# Patient Record
Sex: Female | Born: 1980 | Race: White | Hispanic: Yes | Marital: Married | Smoking: Never smoker
Health system: Southern US, Community
[De-identification: ages and names within clinical notes are randomized; demographics above are authoritative.]

## PROBLEM LIST (undated history)

## (undated) ENCOUNTER — Inpatient Hospital Stay (HOSPITAL_COMMUNITY): Payer: Self-pay

## (undated) HISTORY — PX: GALLBLADDER SURGERY: SHX652

---

## 2008-11-07 ENCOUNTER — Inpatient Hospital Stay (HOSPITAL_COMMUNITY): Admission: RE | Admit: 2008-11-07 | Discharge: 2008-11-09 | Payer: Self-pay | Admitting: Obstetrics

## 2009-10-01 ENCOUNTER — Emergency Department (HOSPITAL_COMMUNITY): Admission: EM | Admit: 2009-10-01 | Discharge: 2009-10-01 | Payer: Self-pay | Admitting: Emergency Medicine

## 2010-08-14 ENCOUNTER — Other Ambulatory Visit: Payer: Self-pay | Admitting: Family Medicine

## 2010-09-20 LAB — URINALYSIS, ROUTINE W REFLEX MICROSCOPIC
Bilirubin Urine: NEGATIVE
Ketones, ur: NEGATIVE mg/dL
Nitrite: NEGATIVE

## 2010-09-20 LAB — COMPREHENSIVE METABOLIC PANEL
ALT: 60 U/L — ABNORMAL HIGH (ref 0–35)
Alkaline Phosphatase: 82 U/L (ref 39–117)
Calcium: 9.3 mg/dL (ref 8.4–10.5)
Creatinine, Ser: 0.52 mg/dL (ref 0.4–1.2)
Total Bilirubin: 0.6 mg/dL (ref 0.3–1.2)
Total Protein: 7.2 g/dL (ref 6.0–8.3)

## 2010-09-20 LAB — LIPASE, BLOOD: Lipase: 21 U/L (ref 11–59)

## 2010-09-20 LAB — DIFFERENTIAL
Basophils Relative: 0 % (ref 0–1)
Eosinophils Absolute: 0.1 10*3/uL (ref 0.0–0.7)
Lymphs Abs: 1.6 10*3/uL (ref 0.7–4.0)
Monocytes Relative: 6 % (ref 3–12)
Neutro Abs: 5.9 10*3/uL (ref 1.7–7.7)

## 2010-09-20 LAB — CBC
MCHC: 33.8 g/dL (ref 30.0–36.0)
MCV: 83.2 fL (ref 78.0–100.0)
RDW: 13.7 % (ref 11.5–15.5)
WBC: 8.1 10*3/uL (ref 4.0–10.5)

## 2010-09-20 LAB — URINE MICROSCOPIC-ADD ON

## 2010-10-10 LAB — CBC
MCHC: 34 g/dL (ref 30.0–36.0)
MCHC: 34.4 g/dL (ref 30.0–36.0)
MCV: 79.4 fL (ref 78.0–100.0)
MCV: 80.3 fL (ref 78.0–100.0)
Platelets: 266 10*3/uL (ref 150–400)
RBC: 3.75 MIL/uL — ABNORMAL LOW (ref 3.87–5.11)
WBC: 10.1 10*3/uL (ref 4.0–10.5)

## 2011-09-13 ENCOUNTER — Emergency Department (INDEPENDENT_AMBULATORY_CARE_PROVIDER_SITE_OTHER): Payer: Self-pay

## 2011-09-13 ENCOUNTER — Emergency Department (INDEPENDENT_AMBULATORY_CARE_PROVIDER_SITE_OTHER)
Admission: EM | Admit: 2011-09-13 | Discharge: 2011-09-13 | Disposition: A | Discharge status: Home / Self Care | Payer: Self-pay | Source: Home / Self Care | Attending: Emergency Medicine | Admitting: Emergency Medicine

## 2011-09-13 ENCOUNTER — Encounter (HOSPITAL_COMMUNITY): Payer: Self-pay | Admitting: Emergency Medicine

## 2011-09-13 DIAGNOSIS — J40 Bronchitis, not specified as acute or chronic: Secondary | ICD-10-CM

## 2011-09-13 MED ORDER — AMOXICILLIN 500 MG PO CAPS: 1000.0000 mg | ORAL_CAPSULE | Freq: Three times a day (TID) | ORAL | Status: AC

## 2011-09-13 MED ORDER — ALBUTEROL SULFATE HFA 108 (90 BASE) MCG/ACT IN AERS: 1.0000 | INHALATION_SPRAY | Freq: Four times a day (QID) | RESPIRATORY_TRACT | Status: AC | PRN

## 2011-09-13 MED ORDER — BENZONATATE 200 MG PO CAPS: 200.0000 mg | ORAL_CAPSULE | Freq: Three times a day (TID) | ORAL | Status: AC | PRN

## 2011-09-13 NOTE — ED Notes (Signed)
Left eye pain, stuffy head, runny nose, reports cough.  Denies fever.

## 2011-09-13 NOTE — Discharge Instructions (Signed)
Bronquitis (Bronchitis) La bronquitis es [employer] (el modo que tiene el organismo de Publishing rights manager a una lesin o infeccin) de los bronquios Los bronquios son los conductos que se extienden desde la trquea [employer]. Si la inflamacin se agrava, puede causar la falta de aire. CAUSAS Las causas de la inflamacin pueden ser:  Un virus   Grmenes (bacteria).   Polvo   Alergenos   La polucin y muchos otros irritantes  Las clulas que revisten el rbol bronquial estn cubiertas con pequeos pelos (cilias). Esta constantemente producen un movimiento desde los pulmones hacia la boca. De este modo se mantienen los pulmones libres de polucin. Cuando estas clulas se irritan y no pueden cumplir su funcin, comienza a formarse la mucosidad. Esto produce la caracterstica tos de la bronquitis. La tos es el mecanismo por el cual se limpian los pulmones cuando las cilias no pueden cumplir su funcin. Sin alguno de [employer], Animator se Psychologist, clinical los pulmones Entonces se desarrollara una pulmona.  El fumar es una de las causas ms frecuentes de bronquitis y puede contribuir a la neumona. Abandonar este hbito es lo ms importante que puede hacer para beneficiarse. TRATAMIENTO  El [employer] prescribir antibiticos si la causa es una bacteria, y medicamentos para abrir las vas areas y Personnel officer. Tambin puede recomendar o prescribir un expectorante. El expectorante aflojar la mucosidad para que pueda eliminarla. Slo tome medicamentos de Sales promotion account executive o prescriptos para Primary school teacher, las [place], o bajar la fiebre segn las indicaciones de su mdico.   Pharmacologist todo lo que causa el problema (por ejemplo el hbito de Art therapist) es fundamental para evitar que empeore.   Un antitusgeno puede prescribirse para [employer] de la tos.   Podrn indicarle inhalantes para aliviar los sntomas actuales y ayudar a prevenir problemas futuros.    Aquellos que sufren bronquitis crnica (recurrente) puede ser necesaria la administracin de corticoides.  SOLICITE ATENCIN MDICA INMEDIATAMENTE SI:  Durante el tratamiento observa que elimina esputo similar a pus (purulento).   Tiene fiebre.   Su beb tiene ms de 3 meses y su temperatura rectal es de 102 F (38.9 C) o ms.   Su beb tiene 3 meses o menos y su temperatura rectal es de 100.4 F (38 C) o ms.   Se siente cada vez ms enfermo.   Tiene cada vez ms dificultad para respirar, tiene ruidos al respirar o Company secretary.  Es necesario buscar atencin mdica inmediata si es [place] persona de edad avanzada o sufre alguna otra enfermedad. ASEGURESE DE QUE:   Comprende estas instrucciones.   Controlar su enfermedad.   Solicitar ayuda inmediatamente si no mejora o si empeora.  Document Released: 06/18/2005 Document Revised: 06/07/2011 [facility] Patient Information 2012 [place], [place].   Most upper respiratory infections are caused by viruses and do not require antibiotics.  We try to save the antibiotics for when we really need them to avoid resistance.  This does not mean that there is nothing that can be done.  Here are a few hints about things that can be done at home to get over an upper respiratory infection quicker:  Get extra sleep and extra fluids.  Get 7 to 9 hours of sleep per night and 6 to 8 glasses of water a day.  Getting extra sleep keeps the immune system from getting run down.  Most people with an upper respiratory infection are a little dehydrated.  The extra fluids  also keep the secretions liquified and easier to deal with.  Also, get extra vitamin C.  4000 mg per day is the recommended dose. For the aches, headache, and fever, acetaminophen or ibuprofen are helpful.  These can be alternated every 4 hours.  People with liver disease should avoid large amounts of acetaminophen, and people with ulcer disease, gastroesophageal reflux, gastritis, congestive  heart failure, chronic kidney disease, coronary artery disease and the elderly should avoid ibuprofen. For nasal congestion try Mucinex-D, or if you're having lots of sneezing or copious clear nasal drainage Allegra-D-24 hour.  A Saline nasal spray such as Ocean Spray can also help as can decongestant sprays such as Afrin, but you should not use the decongestant sprays for more than 3 or 4 days since they can be habituating.  If nasal dryness is a problem, Ayr Nasal Gel can help moisturize your nasal passages.  Breath Rite nasal strips can also offer a non-drug alternative treatment to nasal congestion, especially at night. For people with symptoms of sinusitis, sleeping with your head elevated can be helpful.  For sinus pain, moist, hot compresses to the face may provide some relief.  Many people find that inhaling steam as in a shower or from a pot of steaming water can help. For sore throat, zinc containing lozenges such as Cold-Eze or Zicam are helpful.  Zinc helps to fight infection and has a mild astringent effect that relieves the sore, achey throat.  Hot salt water gargles (8 oz of hot water, 1/2 tsp of table salt, and a pinch of baking soda) can give relief as well as hot beverages such as hot tea. For the cough, old time remedies such as honey or honey and lemon are tried and true.  Over the counter cough syrups such as Delsym 2 tsp every 12 hours can help as well.  It's important when you have an upper respiratory infection not to pass the infection to others.  This involves being very careful about the following:  Frequent hand washing or use of hand sanitizer, especially after coughing, sneezing, blowing your nose or touching your face, nose or eyes. Do not shake hands or touch anyone and try to avoid touching surfaces that other people use such as doorknobs, shopping carts, telephones and computer keyboards. Use tissues and dispose of them properly in a garbage can or ziplock bag. Cough into  your sleeve. Do not let others eat or drink after you.  It's also important to recognize the signs of serious illness and get evaluated if they occur: Any respiratory infection that lasts more than 7 to 10 days.  Yellow nasal drainage and sputum are not reliable indicators of a bacterial infection, but if they last for more than 1 week, see your doctor. Fever and sore throat can indicate strep. Fever and cough can indicate influenza or pneumonia. Any kind of severe symptom such as difficulty breathing, intractable vomiting, or severe pain should prompt you to see a doctor as soon as possible.   Your body's immune system is really the thing that will get rid of this infection.  Your immune system is comprised of 2 types of specialized cells called T cells and B cells.  T cells coordinate the array of cells in your body that engulf invading bacteria or viruses while B cells orchestrate the production of antibodies that neutralize infection.  Anything we do or any medications we give you, will just strengthen your immune system or help it clear up the  infection quicker.  Here are a few helpful hints to improve your immune system to help overcome this illness or to prevent future infections:  A few vitamins can improve the health of your immune system.  That's why your diet should include plenty of fruits, vegetables, fish, nuts, and whole grains.  Vitamin A and bet-carotene can increase the cells that fight infections (T cells and B cells).  Vitamin A is abundant in dark greens and orange vegetables such as spinach, greens, sweet potatoes, and carrots.  Vitamin B6 contributes to the maturation of white blood cells, the cells that fight disease.  Foods with vitamin B6 include cold cereal and bananas.  Vitamin C is credited with preventing colds because it increases white blood cells and also prevents cellular damage.  Citrus fruits, peaches and green and red bell peppers are all hight in vitamin  C.  Vitamin E is an anti-oxidant that encourages the production of natural killer cells which reject foreign invaders and B cells that produce antibodies.  Foods high in vitamin E include wheat germ, nuts and seeds.  Foods high in omega-3 fatty acids found in foods like salmon, tuna and mackerel boost your immune system and help cells to engulf and absorb germs.  Probiotics are good bacteria that increase your T cells.  These can be found in yogurt and are available in supplements such as Culturelle or Align.  Moderate exercise increases the strength of your immune system and your ability to recover from illness.  I suggest 3 to 5 moderate intensity 30 minute workouts per week.    Sleep is another component of maintaining a strong immune system.  It enables your body to recuperate from the day's activities, stress and work.  My recommendation is to get between 7 and 9 hours of sleep per night.  If you smoke, try to quit completely or at least cut down.  Drink alcohol only in moderation if at all.  No more than 2 drinks daily for men or 1 for women.  Get a flu vaccine early in the fall or if you have not gotten one yet, once this illness has run its course.  If you are over 65, a smoker, or an asthmatic, get a pneumococcal vaccine.  My final recommendation is to maintain a healthy weight.  Excess weight can impair the immune system by interfering with the way the immune system deals with invading viruses or bacteria.

## 2011-09-13 NOTE — ED Provider Notes (Signed)
Chief Complaint  Patient presents with  . URI    History of Present Illness:   Tabitha Thompson is a 30 year old female who's had a one-week history of a cough productive of green sputum with occasional spots of blood, chest tightness, nasal congestion, headache, pain behind her eyes, and sore throat. She denies any fever or chills, nausea, vomiting, or diarrhea.  Review of Systems:  Other than noted above, the patient denies any of the following symptoms. Systemic:  No fever, chills, sweats, fatigue, myalgias, headache, or anorexia. Eye:  No redness, pain or drainage. ENT:  No earache, nasal congestion, rhinorrhea, sinus pressure, or sore throat. Lungs:  No cough, sputum production, wheezing, shortness of breath. Or chest pain. GI:  No nausea, vomiting, abdominal pain or diarrhea. Skin:  No rash or itching.  PMFSH:  Past medical history, family history, social history, meds, and allergies were reviewed.  Physical Exam:   Vital signs:  BP 114/80  Pulse 103  Temp(Src) 98.1 F (36.7 C) (Oral)  Resp 16  SpO2 99%  LMP 09/06/2011 General:  Alert, in no distress. Eye:  No conjunctival injection or drainage. ENT:  TMs and canals were normal, without erythema or inflammation.  Nasal mucosa was markedly congested bilaterally with a clear drainage.  Mucous membranes were moist.  Pharynx was clear, without exudate or drainage.  There were no oral ulcerations or lesions. Neck:  Supple, no adenopathy, tenderness or mass. Lungs:  No respiratory distress.  Lungs were clear to auscultation, without wheezes, rales or rhonchi.  Breath sounds were clear and equal bilaterally. Heart:  Regular rhythm, without gallops, murmers or rubs. Skin:  Clear, warm, and dry, without rash or lesions.   Radiology:  Dg Chest 2 View  09/13/2011  *RADIOLOGY REPORT*  Clinical Data: Cough, upper respiratory symptoms, sore throat, fever  CHEST - 2 VIEW  Comparison: None.  Findings: Low lung volumes accentuating the vascular markings.  Normal heart size.  Negative for pneumonia or edema.  No effusion or pneumothorax.  Trachea is midline.  Previous cholecystectomy evident.  IMPRESSION: Low volume exam.  No acute chest process  Original Report Authenticated By: Judie Petit. Ruel Favors, M.D.    Assessment:   Diagnoses that have been ruled out:  None  Diagnoses that are still under consideration:  None  Final diagnoses:  Bronchitis      Plan:   1.  The following meds were prescribed:   New Prescriptions   ALBUTEROL (PROVENTIL HFA;VENTOLIN HFA) 108 (90 BASE) MCG/ACT INHALER    Inhale 1-2 puffs into the lungs every 6 (six) hours as needed for wheezing.   AMOXICILLIN (AMOXIL) 500 MG CAPSULE    Take 2 capsules (1,000 mg total) by mouth 3 (three) times daily.   BENZONATATE (TESSALON) 200 MG CAPSULE    Take 1 capsule (200 mg total) by mouth 3 (three) times daily as needed for cough.   2.  The patient was instructed in symptomatic care and handouts were given. 3.  The patient was told to return if becoming worse in any way, if no better in 3 or 4 days, and given some red flag symptoms that would indicate earlier return.'  Reuben Likes, MD 09/13/11 1635

## 2013-05-19 ENCOUNTER — Inpatient Hospital Stay (HOSPITAL_COMMUNITY): Payer: Medicaid Other

## 2013-05-19 ENCOUNTER — Inpatient Hospital Stay (HOSPITAL_COMMUNITY)
Admission: AD | Admit: 2013-05-19 | Discharge: 2013-05-19 | Disposition: A | Discharge status: Home / Self Care | Payer: Self-pay | Source: Ambulatory Visit | Attending: Obstetrics & Gynecology | Admitting: Obstetrics & Gynecology

## 2013-05-19 ENCOUNTER — Encounter (HOSPITAL_COMMUNITY): Payer: Self-pay | Admitting: *Deleted

## 2013-05-19 DIAGNOSIS — N390 Urinary tract infection, site not specified: Secondary | ICD-10-CM | POA: Insufficient documentation

## 2013-05-19 DIAGNOSIS — O2341 Unspecified infection of urinary tract in pregnancy, first trimester: Secondary | ICD-10-CM

## 2013-05-19 DIAGNOSIS — Z3490 Encounter for supervision of normal pregnancy, unspecified, unspecified trimester: Secondary | ICD-10-CM

## 2013-05-19 DIAGNOSIS — B3731 Acute candidiasis of vulva and vagina: Secondary | ICD-10-CM | POA: Insufficient documentation

## 2013-05-19 DIAGNOSIS — O2390 Unspecified genitourinary tract infection in pregnancy, unspecified trimester: Secondary | ICD-10-CM | POA: Insufficient documentation

## 2013-05-19 DIAGNOSIS — B373 Candidiasis of vulva and vagina: Secondary | ICD-10-CM

## 2013-05-19 DIAGNOSIS — O209 Hemorrhage in early pregnancy, unspecified: Secondary | ICD-10-CM | POA: Insufficient documentation

## 2013-05-19 LAB — URINE MICROSCOPIC-ADD ON

## 2013-05-19 LAB — URINALYSIS, ROUTINE W REFLEX MICROSCOPIC
Bilirubin Urine: NEGATIVE
Glucose, UA: NEGATIVE mg/dL
Ketones, ur: NEGATIVE mg/dL
Protein, ur: NEGATIVE mg/dL
Specific Gravity, Urine: >1.03 — ABNORMAL HIGH (ref 1.005–1.030)
pH: 6 (ref 5.0–8.0)

## 2013-05-19 LAB — WET PREP, GENITAL: Clue Cells Wet Prep HPF POC: NONE SEEN

## 2013-05-19 LAB — CBC
HCT: 38.8 % (ref 36.0–46.0)
MCH: 26.4 pg (ref 26.0–34.0)
MCV: 77 fL — ABNORMAL LOW (ref 78.0–100.0)

## 2013-05-19 MED ORDER — FLUCONAZOLE 150 MG PO TABS
150.0000 mg | ORAL_TABLET | Freq: Once | ORAL | Status: AC
  Administered 2013-05-19: 150 mg via ORAL
  Filled 2013-05-19: qty 1

## 2013-05-19 MED ORDER — FLUCONAZOLE 150 MG PO TABS: 150.0000 mg | ORAL_TABLET | Freq: Every day | ORAL | Status: DC

## 2013-05-19 NOTE — MAU Note (Signed)
Was dx with a UTI at clinic and given rx.

## 2013-05-19 NOTE — MAU Provider Note (Signed)
History     CSN: [id number]  Arrival date and time: 05/19/13 1550   First Provider Initiated Contact with Patient 05/19/13 1652      Chief Complaint  Patient presents with  . Threatened Miscarriage   HPI  [name] is a 32 y.o. female who presents to MAU with complaints of vaginal bleeding that started this morning. [facility]; she was seen there last Monday and Wednesday. At that time they did not do an [place]. The bleeding is very mild, and only noticed it when she wiped. She denies pain at this time. Pt is currently taking Macrobid for a UTI.   OB History   Grav Para Term Preterm Abortions TAB SAB Ect Mult Living                  No past medical history on file.  No past surgical history on file.  No family history on file.  History  Substance Use Topics  . Smoking status: Never Smoker   . Smokeless tobacco: Not on file  . Alcohol Use: No    Allergies: No Known Allergies  Prescriptions prior to admission  Medication Sig Dispense Refill  . ibuprofen (ADVIL,MOTRIN) 200 MG tablet Take 200 mg by mouth every 6 (six) hours as needed.       Results for orders placed during the hospital encounter of 05/19/13 (from the past 24 hour(s))  CBC     Status: Abnormal   Collection Time    05/19/13  4:33 PM      Result Value Range   WBC 9.1  4.0 - 10.5 K/uL   RBC 5.04  3.87 - 5.11 MIL/uL   Hemoglobin 13.3  12.0 - 15.0 g/dL   HCT 40.9  81.1 - 91.4 %   MCV 77.0 (*) 78.0 - 100.0 fL   MCH 26.4  26.0 - 34.0 pg   MCHC 34.3  30.0 - 36.0 g/dL   RDW 78.2  95.6 - 21.3 %   Platelets 246  150 - 400 K/uL  ABO/RH     Status: None   Collection Time    05/19/13  4:33 PM      Result Value Range   ABO/RH(D) A POS    HCG, QUANTITATIVE, PREGNANCY     Status: Abnormal   Collection Time    05/19/13  4:33 PM      Result Value Range   hCG, Beta Chain, Quant, S 31551 (*) <5 mIU/mL  URINALYSIS, ROUTINE W REFLEX  MICROSCOPIC     Status: Abnormal   Collection Time    05/19/13  4:42 PM      Result Value Range   Color, Urine YELLOW  YELLOW   APPearance CLEAR  CLEAR   Specific Gravity, Urine >1.030 (*) 1.005 - 1.030   pH 6.0  5.0 - 8.0   Glucose, UA NEGATIVE  NEGATIVE mg/dL   Hgb urine dipstick TRACE (*) NEGATIVE   Bilirubin Urine NEGATIVE  NEGATIVE   Ketones, ur NEGATIVE  NEGATIVE mg/dL   Protein, ur NEGATIVE  NEGATIVE mg/dL   Urobilinogen, UA 0.2  0.0 - 1.0 mg/dL   Nitrite NEGATIVE  NEGATIVE   Leukocytes, UA MODERATE (*) NEGATIVE  URINE MICROSCOPIC-ADD ON     Status: Abnormal   Collection Time    05/19/13  4:42 PM      Result Value Range   Squamous Epithelial / LPF FEW (*) RARE  WBC, UA 0-2  <3 WBC/hpf   RBC / HPF 0-2  <3 RBC/hpf   Bacteria, UA RARE  RARE   Urine-Other MUCOUS PRESENT    POCT PREGNANCY, URINE     Status: Abnormal   Collection Time    05/19/13  4:56 PM      Result Value Range   Preg Test, Ur POSITIVE (*) NEGATIVE  WET PREP, GENITAL     Status: Abnormal   Collection Time    05/19/13  5:57 PM      Result Value Range   Yeast Wet Prep HPF POC MODERATE (*) NONE SEEN   Trich, Wet Prep NONE SEEN  NONE SEEN   Clue Cells Wet Prep HPF POC NONE SEEN  NONE SEEN   WBC, Wet Prep HPF POC FEW (*) NONE SEEN    US Ob Comp Less 14 Wks  05/19/2013   CLINICAL DATA:  Early pregnancy.  Vaginal bleeding.  EXAM: TRANSVAGINAL OB ULTRASOUND; OBSTETRIC <14 WK ULTRASOUND  TECHNIQUE: Transvaginal ultrasound was performed for complete evaluation of the gestation as well as the maternal uterus, adnexal regions, and pelvic cul-de-sac.  COMPARISON:  No priors.  FINDINGS: Intrauterine gestational sac: Single ovoid shaped gestational sac in the fundal portion of the endometrial canal.  Yolk sac:  Present.  Embryo:  Present.  Cardiac Activity: Present.  Heart Rate: 160 bpm  CRL:   15  mm   7 w 6 d                  [place] EDC: 12/30/2013  Maternal uterus/adnexae: No evidence of subchorionic hemorrhage at this  time. Right ovary is poorly visualized, however, there appears to be a dominant follicle or degenerating corpus luteum cyst in the right ovary. Left ovary is unremarkable in appearance. No significant free fluid in the cul-de-sac.  IMPRESSION: 1. Single viable IUP with estimated gestational age of [redacted] weeks and 6 days and normal fetal heart rate of 160 beats per min. No acute findings.   Electronically Signed   By: Trudie Reed M.D.   On: 05/19/2013 17:50   US Ob Transvaginal  05/19/2013   CLINICAL DATA:  Early pregnancy.  Vaginal bleeding.  EXAM: TRANSVAGINAL OB ULTRASOUND; OBSTETRIC <14 WK ULTRASOUND  TECHNIQUE: Transvaginal ultrasound was performed for complete evaluation of the gestation as well as the maternal uterus, adnexal regions, and pelvic cul-de-sac.  COMPARISON:  No priors.  FINDINGS: Intrauterine gestational sac: Single ovoid shaped gestational sac in the fundal portion of the endometrial canal.  Yolk sac:  Present.  Embryo:  Present.  Cardiac Activity: Present.  Heart Rate: 160 bpm  CRL:   15  mm   7 w 6 d                  [place] EDC: 12/30/2013  Maternal uterus/adnexae: No evidence of subchorionic hemorrhage at this time. Right ovary is poorly visualized, however, there appears to be a dominant follicle or degenerating corpus luteum cyst in the right ovary. Left ovary is unremarkable in appearance. No significant free fluid in the cul-de-sac.  IMPRESSION: 1. Single viable IUP with estimated gestational age of [redacted] weeks and 6 days and normal fetal heart rate of 160 beats per min. No acute findings.   Electronically Signed   By: Trudie Reed M.D.   On: 05/19/2013 17:50    Review of Systems  Constitutional: Positive for chills. Negative for fever.  Gastrointestinal: Positive for nausea and vomiting. Negative for abdominal pain, diarrhea and  constipation.  Genitourinary: Positive for dysuria, urgency and frequency. Negative for hematuria.       + vaginal discharge; noticed yellow discharge  this past Sunday.  + vaginal bleeding. No dysuria.    Physical Exam   Blood pressure 147/84, pulse 94, temperature 98.9 F (37.2 C), temperature source Oral, resp. rate 20, height 5' 2.5" (1.588 m), weight 85.276 kg (188 lb).  Physical Exam  Constitutional: She is oriented to person, place, and time. She appears well-developed and well-nourished. No distress.  HENT:  Head: Normocephalic.  Eyes: Pupils are equal, round, and reactive to light.  Neck: Neck supple.  Respiratory: Effort normal.  GI: Soft. She exhibits no distension. There is no tenderness. There is no rebound and no guarding.  Genitourinary: Vaginal discharge found.  Speculum exam: Vagina - Moderate amount of creamy, yellow discharge, no odor, mild erythema  Cervix - Thick, mucus like discharge at cervical os. Bimanual exam: Cervix closed Uterus non tender, normal size; gravid  Adnexa non tender, no masses bilaterally GC/Chlam, wet prep done Chaperone present for exam.   Musculoskeletal: Normal range of motion.  Neurological: She is alert and oriented to person, place, and time.  Skin: Skin is warm. She is not diaphoretic.    MAU Course  Procedures None  MDM UA CBC CMET ABO/RH UA  A positive blood type  Assessment and Plan   A:  1. Urinary tract infection during pregnancy in first trimester   2. Normal IUP (intrauterine pregnancy) on prenatal ultrasound   3. Yeast vaginitis     P: Discharge home RX: Diflucan  Continue Macrobid   Return to MAU as needed, if symptoms worsen.  Start prenatal care as soon as possible; pregnancy verification letter provided.   RASCH, JENNIFER IRENE NP 05/19/2013, 6:43 PM

## 2013-05-19 NOTE — MAU Note (Addendum)
[facility], [facility] and [facility].  Today she did not feel well and started bleeding this morning.  No bleeding when went to bathroom now.

## 2013-05-19 NOTE — MAU Note (Signed)
Pt states she vaginal bleeding this morning but bleeding has stopped

## 2013-05-19 NOTE — MAU Note (Addendum)
Not in lobby, in restroom 

## 2013-05-19 NOTE — MAU Note (Signed)
Sat, she started have UTI symptoms; freq and some burning.

## 2013-05-20 LAB — GC/CHLAMYDIA PROBE AMP: GC Probe RNA: NEGATIVE

## 2013-06-18 LAB — OB RESULTS CONSOLE RUBELLA ANTIBODY, IGM: RUBELLA: IMMUNE

## 2013-06-18 LAB — OB RESULTS CONSOLE ANTIBODY SCREEN: Antibody Screen: NEGATIVE

## 2013-06-18 LAB — OB RESULTS CONSOLE GC/CHLAMYDIA
Chlamydia: NEGATIVE
Gonorrhea: NEGATIVE

## 2013-06-18 LAB — OB RESULTS CONSOLE ABO/RH: RH TYPE: POSITIVE

## 2013-06-18 LAB — OB RESULTS CONSOLE RPR: RPR: NONREACTIVE

## 2013-06-18 LAB — CYTOLOGY - PAP: Pap: NEGATIVE

## 2013-06-18 LAB — OB RESULTS CONSOLE HIV ANTIBODY (ROUTINE TESTING): HIV: NONREACTIVE

## 2013-06-18 LAB — OB RESULTS CONSOLE HEPATITIS B SURFACE ANTIGEN: Hepatitis B Surface Ag: NEGATIVE

## 2013-07-02 NOTE — L&D Delivery Note (Signed)
Delivery Note At 1:28 PM a viable female was delivered via Vaginal, Vacuum (Extractor) (Presentation: ; Occiput Anterior).  APGAR: 7, 9; weight .   Placenta status: Intact, Spontaneous.  Cord: 3 vessels with the following complications: None.  Cord pH: not done  Anesthesia: None  Episiotomy: Median Lacerations: None Suture Repair: 2.0 vicryl Est. Blood Loss (mL):   Mom to postpartum.  Baby to Couplet care / Skin to Skin.  MARSHALL,BERNARD A 01/02/2014, 1:53 PM

## 2013-07-28 ENCOUNTER — Other Ambulatory Visit (HOSPITAL_COMMUNITY): Payer: Self-pay | Admitting: Obstetrics

## 2013-07-28 DIAGNOSIS — Z3689 Encounter for other specified antenatal screening: Secondary | ICD-10-CM

## 2013-08-03 ENCOUNTER — Ambulatory Visit (HOSPITAL_COMMUNITY)
Admission: RE | Admit: 2013-08-03 | Discharge: 2013-08-03 | Disposition: A | Discharge status: Home / Self Care | Payer: No Typology Code available for payment source | Source: Ambulatory Visit | Attending: Obstetrics | Admitting: Obstetrics

## 2013-08-03 DIAGNOSIS — Z3689 Encounter for other specified antenatal screening: Secondary | ICD-10-CM

## 2013-08-07 ENCOUNTER — Other Ambulatory Visit (HOSPITAL_COMMUNITY): Payer: Self-pay | Admitting: Obstetrics

## 2013-08-07 DIAGNOSIS — Z3689 Encounter for other specified antenatal screening: Secondary | ICD-10-CM

## 2013-10-01 ENCOUNTER — Ambulatory Visit (HOSPITAL_COMMUNITY)
Admission: RE | Admit: 2013-10-01 | Discharge: 2013-10-01 | Disposition: A | Discharge status: Home / Self Care | Payer: No Typology Code available for payment source | Source: Ambulatory Visit | Attending: Obstetrics | Admitting: Obstetrics

## 2013-10-01 DIAGNOSIS — Z3689 Encounter for other specified antenatal screening: Secondary | ICD-10-CM | POA: Insufficient documentation

## 2013-12-01 LAB — OB RESULTS CONSOLE GC/CHLAMYDIA
CHLAMYDIA, DNA PROBE: NEGATIVE
Gonorrhea: NEGATIVE

## 2013-12-01 LAB — OB RESULTS CONSOLE GBS: GBS: NEGATIVE

## 2014-01-02 ENCOUNTER — Inpatient Hospital Stay (HOSPITAL_COMMUNITY)
Admission: AD | Admit: 2014-01-02 | Discharge: 2014-01-04 | DRG: 775 | Disposition: A | Discharge status: Home / Self Care | Payer: Medicaid Other | Source: Ambulatory Visit | Attending: Obstetrics | Admitting: Obstetrics

## 2014-01-02 ENCOUNTER — Encounter (HOSPITAL_COMMUNITY): Payer: Self-pay | Admitting: *Deleted

## 2014-01-02 DIAGNOSIS — O9279 Other disorders of lactation: Secondary | ICD-10-CM | POA: Diagnosis not present

## 2014-01-02 DIAGNOSIS — O479 False labor, unspecified: Secondary | ICD-10-CM | POA: Diagnosis present

## 2014-01-02 LAB — CBC
HEMATOCRIT: 39 % (ref 36.0–46.0)
Hemoglobin: 13.1 g/dL (ref 12.0–15.0)
MCH: 27.1 pg (ref 26.0–34.0)
MCHC: 33.6 g/dL (ref 30.0–36.0)
MCV: 80.6 fL (ref 78.0–100.0)
Platelets: 231 10*3/uL (ref 150–400)
RBC: 4.84 MIL/uL (ref 3.87–5.11)
RDW: 16.2 % — ABNORMAL HIGH (ref 11.5–15.5)
WBC: 12.9 10*3/uL — ABNORMAL HIGH (ref 4.0–10.5)

## 2014-01-02 LAB — RPR

## 2014-01-02 LAB — TYPE AND SCREEN
ABO/RH(D): A POS
Antibody Screen: NEGATIVE

## 2014-01-02 MED ORDER — DIPHENHYDRAMINE HCL 50 MG/ML IJ SOLN: 12.5000 mg | INTRAMUSCULAR | Status: DC | PRN

## 2014-01-02 MED ORDER — SIMETHICONE 80 MG PO CHEW: 80.0000 mg | CHEWABLE_TABLET | ORAL | Status: DC | PRN

## 2014-01-02 MED ORDER — ONDANSETRON HCL 4 MG PO TABS: 4.0000 mg | ORAL_TABLET | ORAL | Status: DC | PRN

## 2014-01-02 MED ORDER — IBUPROFEN 600 MG PO TABS
600.0000 mg | ORAL_TABLET | Freq: Four times a day (QID) | ORAL | Status: DC | PRN
  Administered 2014-01-02: 600 mg via ORAL
  Filled 2014-01-02: qty 1

## 2014-01-02 MED ORDER — ONDANSETRON HCL 4 MG/2ML IJ SOLN: 4.0000 mg | INTRAMUSCULAR | Status: DC | PRN

## 2014-01-02 MED ORDER — ACETAMINOPHEN 325 MG PO TABS: 650.0000 mg | ORAL_TABLET | ORAL | Status: DC | PRN

## 2014-01-02 MED ORDER — OXYTOCIN BOLUS FROM INFUSION
500.0000 mL | INTRAVENOUS | Status: DC
  Administered 2014-01-02: 500 mL via INTRAVENOUS

## 2014-01-02 MED ORDER — OXYCODONE-ACETAMINOPHEN 5-325 MG PO TABS
1.0000 | ORAL_TABLET | ORAL | Status: DC | PRN
  Administered 2014-01-02: 1 via ORAL
  Filled 2014-01-02: qty 1

## 2014-01-02 MED ORDER — PRENATAL MULTIVITAMIN CH
1.0000 | ORAL_TABLET | Freq: Every day | ORAL | Status: DC
  Administered 2014-01-03 – 2014-01-04 (×2): 1 via ORAL
  Filled 2014-01-02 (×2): qty 1

## 2014-01-02 MED ORDER — TETANUS-DIPHTH-ACELL PERTUSSIS 5-2.5-18.5 LF-MCG/0.5 IM SUSP
0.5000 mL | Freq: Once | INTRAMUSCULAR | Status: AC
  Administered 2014-01-04: 0.5 mL via INTRAMUSCULAR

## 2014-01-02 MED ORDER — EPHEDRINE 5 MG/ML INJ
10.0000 mg | INTRAVENOUS | Status: DC | PRN
  Filled 2014-01-02: qty 2

## 2014-01-02 MED ORDER — OXYTOCIN 40 UNITS IN LACTATED RINGERS INFUSION - SIMPLE MED
62.5000 mL/h | INTRAVENOUS | Status: DC
  Filled 2014-01-02: qty 1000

## 2014-01-02 MED ORDER — PHENYLEPHRINE 40 MCG/ML (10ML) SYRINGE FOR IV PUSH (FOR BLOOD PRESSURE SUPPORT)
80.0000 ug | PREFILLED_SYRINGE | INTRAVENOUS | Status: DC | PRN
  Filled 2014-01-02: qty 2

## 2014-01-02 MED ORDER — DIPHENHYDRAMINE HCL 25 MG PO CAPS: 25.0000 mg | ORAL_CAPSULE | Freq: Four times a day (QID) | ORAL | Status: DC | PRN

## 2014-01-02 MED ORDER — FERROUS SULFATE 325 (65 FE) MG PO TABS
325.0000 mg | ORAL_TABLET | Freq: Two times a day (BID) | ORAL | Status: DC
  Administered 2014-01-02 – 2014-01-04 (×4): 325 mg via ORAL
  Filled 2014-01-02 (×5): qty 1

## 2014-01-02 MED ORDER — OXYCODONE-ACETAMINOPHEN 5-325 MG PO TABS: 1.0000 | ORAL_TABLET | ORAL | Status: DC | PRN

## 2014-01-02 MED ORDER — IBUPROFEN 600 MG PO TABS
600.0000 mg | ORAL_TABLET | Freq: Four times a day (QID) | ORAL | Status: DC
  Administered 2014-01-02 – 2014-01-04 (×8): 600 mg via ORAL
  Filled 2014-01-02 (×9): qty 1

## 2014-01-02 MED ORDER — LIDOCAINE HCL (PF) 1 % IJ SOLN
30.0000 mL | INTRAMUSCULAR | Status: AC | PRN
  Administered 2014-01-02: 30 mL via SUBCUTANEOUS
  Filled 2014-01-02: qty 30

## 2014-01-02 MED ORDER — ONDANSETRON HCL 4 MG/2ML IJ SOLN
4.0000 mg | Freq: Four times a day (QID) | INTRAMUSCULAR | Status: DC | PRN
Start: 2014-01-02 — End: 2014-01-02

## 2014-01-02 MED ORDER — ZOLPIDEM TARTRATE 5 MG PO TABS: 5.0000 mg | ORAL_TABLET | Freq: Every evening | ORAL | Status: DC | PRN

## 2014-01-02 MED ORDER — WITCH HAZEL-GLYCERIN EX PADS: 1.0000 "application " | MEDICATED_PAD | CUTANEOUS | Status: DC | PRN

## 2014-01-02 MED ORDER — BENZOCAINE-MENTHOL 20-0.5 % EX AERO: 1.0000 "application " | INHALATION_SPRAY | CUTANEOUS | Status: DC | PRN

## 2014-01-02 MED ORDER — SENNOSIDES-DOCUSATE SODIUM 8.6-50 MG PO TABS
2.0000 | ORAL_TABLET | ORAL | Status: DC
  Administered 2014-01-03 (×2): 2 via ORAL
  Filled 2014-01-02 (×3): qty 2

## 2014-01-02 MED ORDER — BUTORPHANOL TARTRATE 1 MG/ML IJ SOLN: 1.0000 mg | INTRAMUSCULAR | Status: DC | PRN

## 2014-01-02 MED ORDER — LACTATED RINGERS IV SOLN: 500.0000 mL | Freq: Once | INTRAVENOUS | Status: DC

## 2014-01-02 MED ORDER — FENTANYL 2.5 MCG/ML BUPIVACAINE 1/10 % EPIDURAL INFUSION (WH - ANES): 14.0000 mL/h | INTRAMUSCULAR | Status: DC | PRN

## 2014-01-02 MED ORDER — LACTATED RINGERS IV SOLN
INTRAVENOUS | Status: DC
  Administered 2014-01-02: 12:00:00 via INTRAVENOUS

## 2014-01-02 MED ORDER — LACTATED RINGERS IV SOLN: 500.0000 mL | INTRAVENOUS | Status: DC | PRN

## 2014-01-02 MED ORDER — DIBUCAINE 1 % RE OINT: 1.0000 "application " | TOPICAL_OINTMENT | RECTAL | Status: DC | PRN

## 2014-01-02 MED ORDER — LANOLIN HYDROUS EX OINT: TOPICAL_OINTMENT | CUTANEOUS | Status: DC | PRN

## 2014-01-02 MED ORDER — CITRIC ACID-SODIUM CITRATE 334-500 MG/5ML PO SOLN: 30.0000 mL | ORAL | Status: DC | PRN

## 2014-01-02 NOTE — H&P (Signed)
This is Dr. Francoise CeoBernard Marshall dictating the history and physical on  Tabitha Thompson she's a 33 year old gravida 2 para 1001 at 40 weeks and 3 days [facility] GBS came to labor and delivery fully dilated normal vaginal delivery of a female Apgar 7 and 9 endosonographic meconium to the midline episiotomy and repair with 2-0 Vicryl and the placenta was spontaneous intact Past medical history negative Past surgical history negative Social history negative System review negative Physical exam well-developed female in labor HEENT negative Lungs clear to P&A Breasts engorged Abdomen uterus 20 week postpartum size Pelvic as described above Extremities negative

## 2014-01-02 NOTE — Progress Notes (Signed)
Dr Gaynell FaceMarshall notified of pt's VE, contraction pattern, FHR pattern, orders received to recheck cervix in an hour

## 2014-01-02 NOTE — MAU Note (Signed)
Per Wynona Caneshristine, RN charge, pt to go to room 174

## 2014-01-02 NOTE — MAU Note (Signed)
Pt presents to MAU with complaints of contractions since yesterday with some bloody show noted.

## 2014-01-03 LAB — CBC
HEMATOCRIT: 31.4 % — AB (ref 36.0–46.0)
Hemoglobin: 10.2 g/dL — ABNORMAL LOW (ref 12.0–15.0)
MCH: 26.3 pg (ref 26.0–34.0)
MCHC: 32.5 g/dL (ref 30.0–36.0)
MCV: 80.9 fL (ref 78.0–100.0)
PLATELETS: 218 10*3/uL (ref 150–400)
RBC: 3.88 MIL/uL (ref 3.87–5.11)
RDW: 16.6 % — ABNORMAL HIGH (ref 11.5–15.5)
WBC: 13.3 10*3/uL — AB (ref 4.0–10.5)

## 2014-01-03 NOTE — Lactation Note (Signed)
This note was copied from the chart of Tabitha Thompson. Lactation Consultation Note  Patient Name: Tabitha Thompson [id number]'s Date: 01/03/2014 Reason for consult: Initial assessment;Hyperbilirubinemia.  This baby is now 5432 hours of age and after 2 unsuccessful latch attempts after delivery with a sleepy baby, mom began offering formula and now baby is exclusively bottle-feeding, with formula up to 30 ml's.  Mom had intended upon admission to both breast and bottle/formula feed but informs LC at time of this visit, that she would still like to try breastfeeding and she is willing to pump and feed ebm by bottle if baby not latching.  Baby under phototherapy light and asleep at this time. LC discussed plan with RN, Sheralyn Boatmanoni and will return to assist mom to initiate DEBP.   Maternal Data Formula Feeding for Exclusion: Yes Reason for exclusion: Mother's choice to formula and breast feed on admission  Feeding Feeding Type: Bottle Fed - Formula Nipple Type: Regular  LATCH Score/Interventions         Two unsuccessful latch attempts after delivery, then only bottle-feeding             Lactation Tools Discussed/Used Tools: Pump Breast pump type: Double-Electric Breast Pump Pump Review: Setup, frequency, and cleaning;Milk Storage Initiated by:: J.Dejae Bernet, IBCLC Date initiated:: 01/03/14  STS, cue feedings, hand expression  Consult Status Consult Status: Follow-up Date: 01/04/14 Follow-up type: In-patient    Warrick ParisianBryant, Tillmon Kisling [facility] 01/03/2014, 10:19 PM

## 2014-01-03 NOTE — Progress Notes (Signed)
Patient ID: Tabitha SinghDiana Thompson, female   DOB: January 26, 1981, 33 y.o.   MRN: [id number] Postpartum day one Vital signs normal Fundus firm Lochia moderate Doing well

## 2014-01-03 NOTE — Lactation Note (Signed)
This note was copied from the chart of Boy Alvera SinghDiana Garcia-Lopez. Lactation Consultation Note  Patient Name: Boy Alvera SinghDiana Garcia-Lopez ZOXWR'UToday's Date: 01/03/2014 Reason for consult: Follow-up assessment;Other (Comment) (will need WIC pump (loaner) at discharge) LC assisted mom with FOB present and reviewed use, cleaning and recommendations to pump q3h for 15 minutes (written in Spanish on board) and LC able to speak Spanish and explain plan.  Parents verbalize understanding and decline need for interpreter at this time.  Plan also reported to RN, Sheralyn Boatmanoni.  Hand expression demonstrated and LC encouraged parents to watch newborn channel breastfeeding videos. Mom encouraged to feed baby 8-12 times/24 hours and with feeding cues. LC encouraged review of Baby and Me pp 13-16 for review of BF information in [place].LC provided [employer] brochure in Spanish, and reviewed WH services and list of community and web site resources, especially [employer] website which has information available in [place]..    Maternal Data Formula Feeding for Exclusion: Yes Reason for exclusion: Mother's choice to formula and breast feed on admission  Feeding Feeding Type: Bottle Fed - Formula Nipple Type: Regular  LATCH Score/Interventions                      Lactation Tools Discussed/Used Tools: Pump Breast pump type: Double-Electric Breast Pump WIC Program: Yes (mom receiving WIC while pregnant) Pump Review: Setup, frequency, and cleaning;Milk Storage Initiated by:: J.Kirah Stice, IBCLC Date initiated:: 01/03/14   Consult Status Consult Status: Follow-up Date: 01/04/14 Follow-up type: In-patient    Warrick ParisianBryant, Roc Streett [facility] 01/03/2014, 10:58 PM

## 2014-01-04 NOTE — Progress Notes (Signed)
Ur chart review completed.  

## 2014-01-04 NOTE — Lactation Note (Signed)
This note was copied from the chart of Boy Alvera SinghDiana Garcia-Lopez. Lactation Consultation Note  Patient Name: Boy Alvera SinghDiana Garcia-Lopez QMVHQ'IToday's Date: 01/04/2014 Reason for consult: Follow-up assessment;Difficult latch Shanda BumpsJessica, Spanish interpreter present for visit. Mom recently fed baby but wants to try nipple shield before d/c. Ready to go home. LC attempted to use #24 nipple shield to help with latch, however baby would not latch as baby has had 20 ml of EBM and 27 ml of formula in the past hour.  Mom has been pump and bottle feeding along with supplementing with formula. She has hand pump for home use [facility] Mom reports she did call WIC but did not speak to a counselor and did not leave phone number. Advised Mom to continue pump and bottle for now since baby is too sleepy to latch and LC cannot assess if baby can latch or will latch to nipple shield. Advised Mom to pump every 3 hours for 15-20 minutes to encourage milk production and protect milk supply. Advised to call WIC in am and speak with someone or leave phone number so she can get DEBP. Offered to schedule OP f/u with lactation to work baby to breast, Mom declined. She will advise if she decides on follow up. Engorgement care reviewed if needed.   Maternal Data    Feeding Feeding Type: Breast Fed Nipple Type: Slow - flow Length of feed: 0 min  LATCH Score/Interventions Latch: Too sleepy or reluctant, no latch achieved, no sucking elicited.     Type of Nipple: Everted at rest and after stimulation  Comfort (Breast/Nipple): Filling, red/small blisters or bruises, mild/mod discomfort  Problem noted: Filling  Hold (Positioning): Assistance needed to correctly position infant at breast and maintain latch.     Lactation Tools Discussed/Used Tools: Pump Breast pump type: Double-Electric Breast Pump   Consult Status Consult Status: Complete Date: 01/04/14 Follow-up type: In-patient    Alfred LevinsGranger, Ashonte Angelucci  Ann 01/04/2014, 4:13 PM

## 2014-01-04 NOTE — Discharge Instructions (Signed)
Discharge instructions ° °· You can wash your hair °· Shower °· Eat what you want °· Drink what you want °· See me in 6 weeks °· Your ankles are going to swell more in the next 2 weeks than when pregnant °· No sex for 6 weeks ° ° °Kaydance Bowie A, MD 01/04/2014 ° ° °

## 2014-01-04 NOTE — Progress Notes (Signed)
Patient ID: Alvera SinghDiana Thompson, female   DOB: 10/18/1980, 33 y.o.   MRN: [id number] Postpartum day 2 Vital signs normal Fundus firm Doing well Home today

## 2014-01-04 NOTE — Discharge Summary (Signed)
Obstetric Discharge Summary Reason for Admission: induction of labor Prenatal Procedures: none Intrapartum Procedures: spontaneous vaginal delivery Postpartum Procedures: none Complications-Operative and Postpartum: none Hemoglobin  Date Value Ref Range Status  01/03/2014 10.2* 12.0 - 15.0 g/dL Final     DELTA CHECK NOTED     REPEATED TO VERIFY     HCT  Date Value Ref Range Status  01/03/2014 31.4* 36.0 - 46.0 % Final    Physical Exam:  General: alert Lochia: appropriate Uterine Fundus: firm Incision: healing well DVT Evaluation: No evidence of DVT seen on physical exam.  Discharge Diagnoses: Term Pregnancy-delivered  Discharge Information: Date: 01/04/2014 Activity: pelvic rest Diet: routine Medications: Percocet Condition: stable Instructions: refer to practice specific booklet Discharge to: home Follow-up Information   Follow up with MARSHALL,BERNARD A, MD. Schedule an appointment as soon as possible for a visit in 6 weeks.   Specialty:  Obstetrics and Gynecology   Contact information:   [address] [place] [id number] [phone]       Newborn Data: Live born female  Birth Weight: 6 lb 13.2 oz (3095 g) APGAR: 7, 9  Home with mother.  MARSHALL,BERNARD A 01/04/2014, 6:22 AM

## 2014-01-04 NOTE — Lactation Note (Signed)
This note was copied from the chart of Boy Alvera SinghDiana Garcia-Lopez. Lactation Consultation Note  Patient Name: Boy Alvera SinghDiana Garcia-Lopez ZOXWR'UToday's Date: 01/04/2014  Arlyn DunningEda, Spanish interpreter present for visit. Mom is not able to get baby to latch consistently. She just gave baby 30 ml of Gerber formula so baby not hungry at this visit. Mom reports her plan is to keep offering the breast, pump to bring in milk and supplement with formula as needed. [facility] [place] Not sure of d/c today, awaiting bili levels this afternoon. Discussed WIC loaner program if Mom will need this. Stressed importance of consistent pumping to bring in milk supply till baby can latch. Encouraged [facility] for assist with baby at the breast.    Maternal Data    Feeding Feeding Type: Formula Nipple Type: Slow - flow  LATCH Score/Interventions                      Lactation Tools Discussed/Used     Consult Status      Alfred LevinsGranger, Inigo Lantigua Ann 01/04/2014, 1:58 PM

## 2014-03-13 IMAGING — CR DG CHEST 2V
2 series · 2 of 2 positions shown · non-contrast
Comparison: None.

CLINICAL DATA: Cough, upper respiratory symptoms, sore throat,
fever

CHEST - 2 VIEW

[view not recorded (1 of 2)]
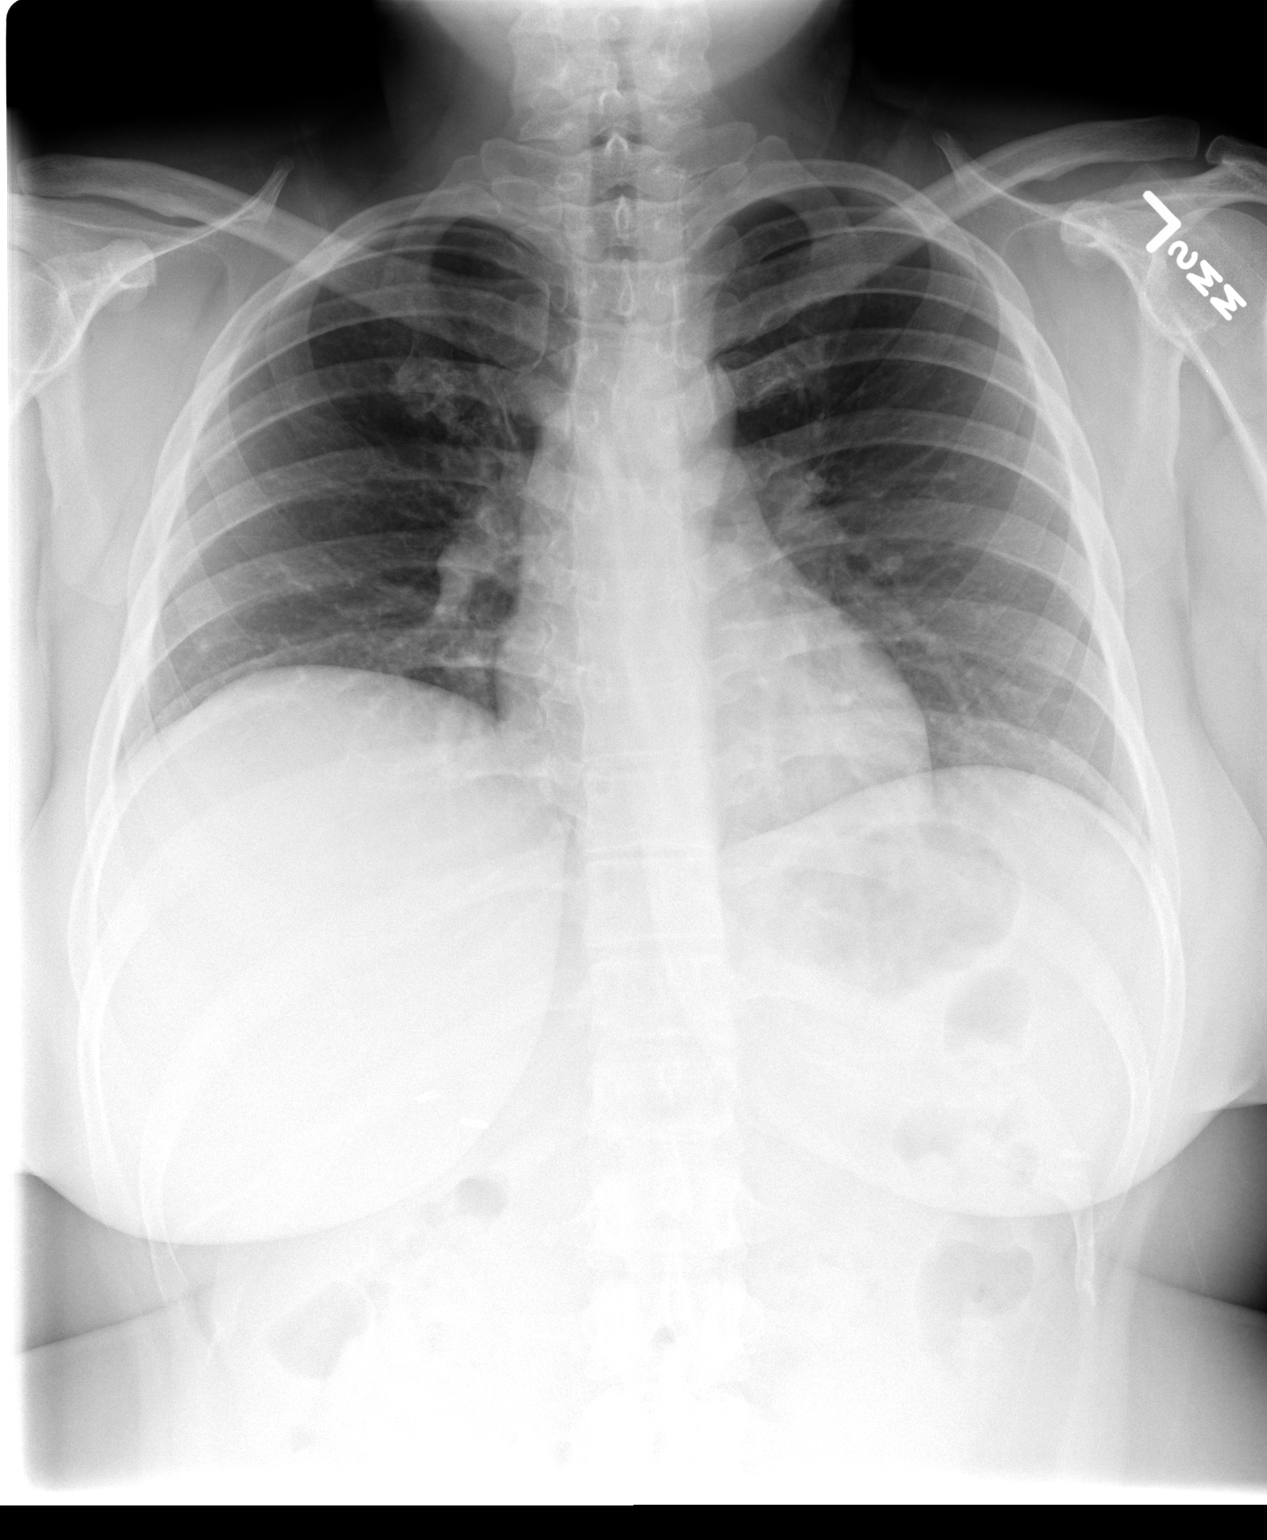

[view not recorded (2 of 2)]
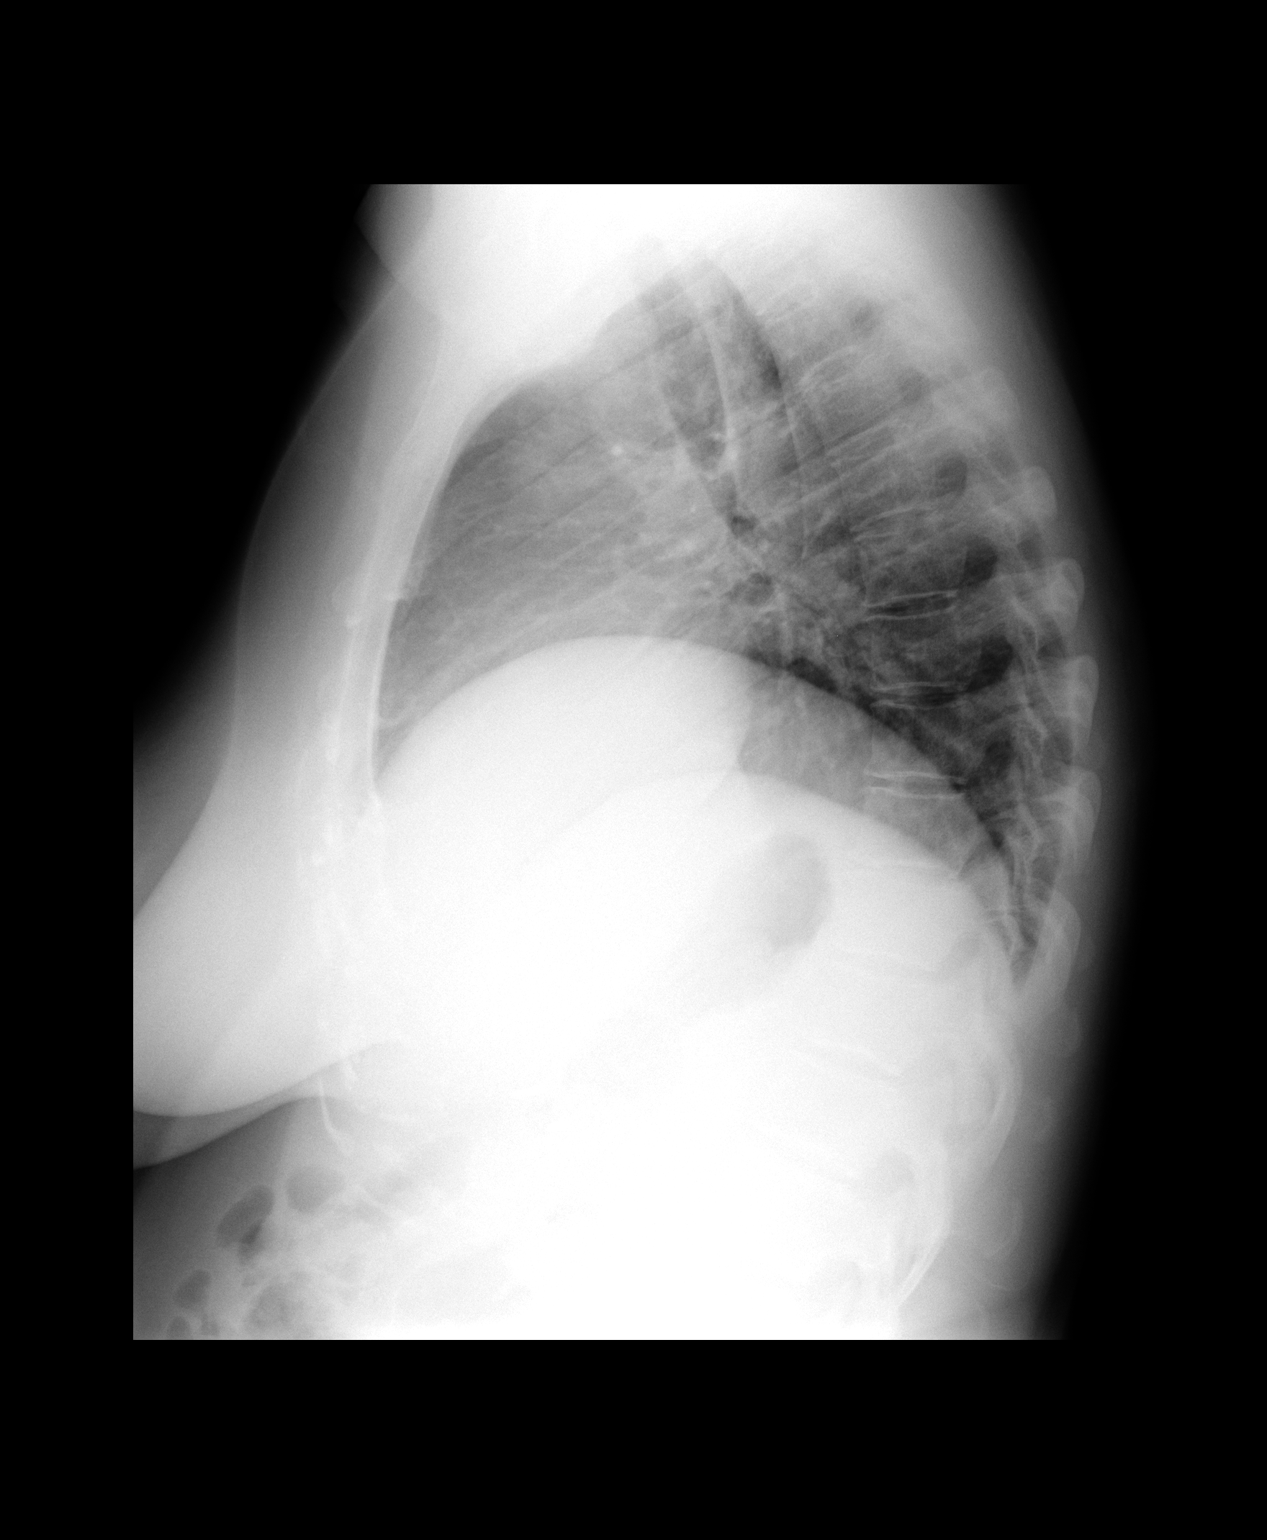

[2 of 2 positions shown; findings below may reference images not displayed]

FINDINGS: Low lung volumes accentuating the vascular markings.
Normal heart size.  Negative for pneumonia or edema.  No effusion
or pneumothorax.  Trachea is midline.  Previous cholecystectomy
evident.
IMPRESSION: Low volume exam.  No acute chest process

## 2014-05-03 ENCOUNTER — Encounter (HOSPITAL_COMMUNITY): Payer: Self-pay | Admitting: *Deleted

## 2015-11-17 IMAGING — US US OB TRANSVAGINAL
1 series · 14 of 23 positions shown · non-contrast
Comparison: No priors.

CLINICAL DATA: Early pregnancy.  Vaginal bleeding.

EXAM:
TRANSVAGINAL OB ULTRASOUND; OBSTETRIC <14 WK ULTRASOUND
TECHNIQUE: Transvaginal ultrasound was performed for complete evaluation of the
gestation as well as the maternal uterus, adnexal regions, and
pelvic cul-de-sac.

[Series 1: us ob comp less 14 wks · 23 acquisitions, 14 frames shown]
[im 1/23]
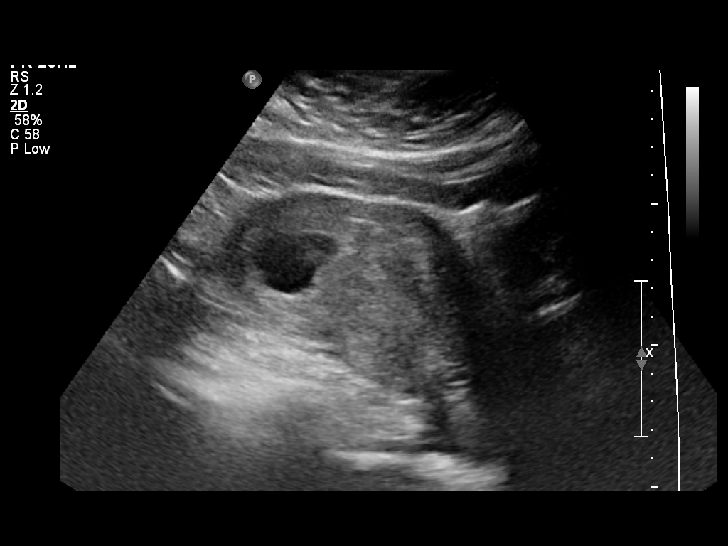
[im 3/23]
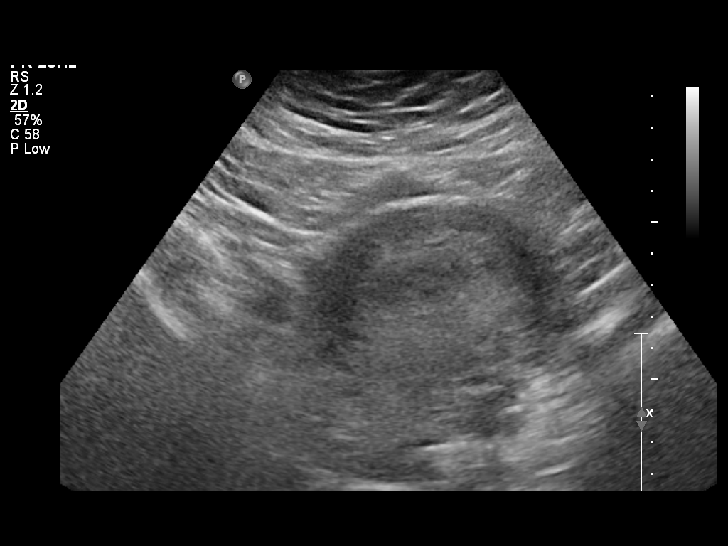
[im 5/23]
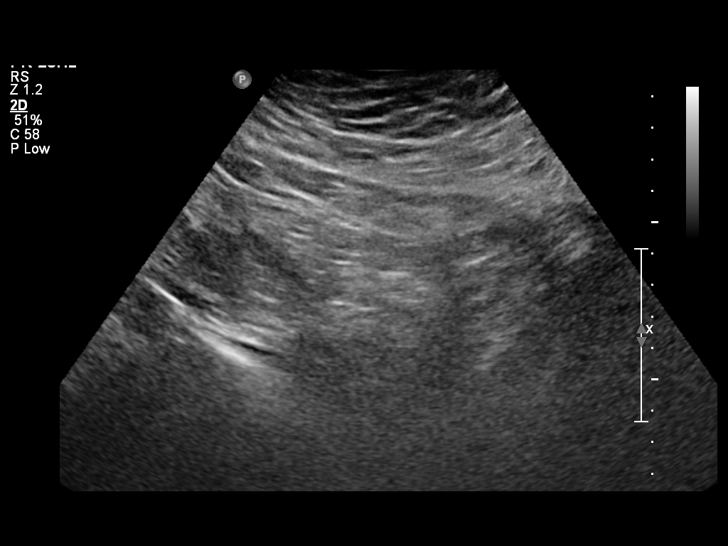
[im 6/23]
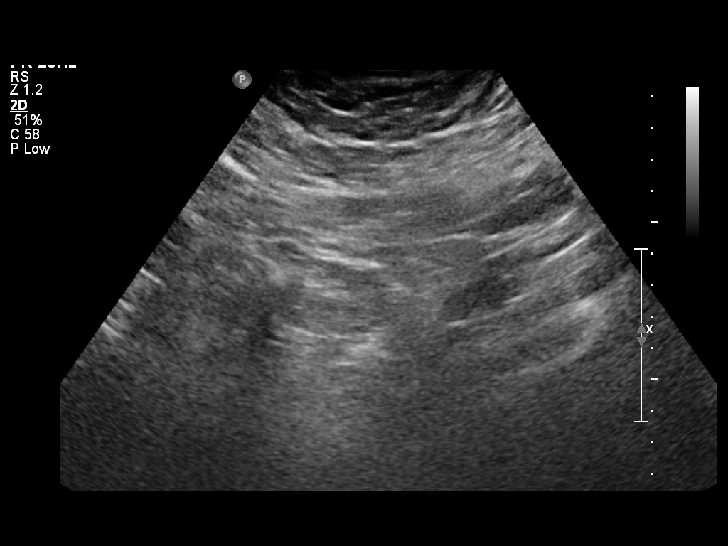
[im 8/23]
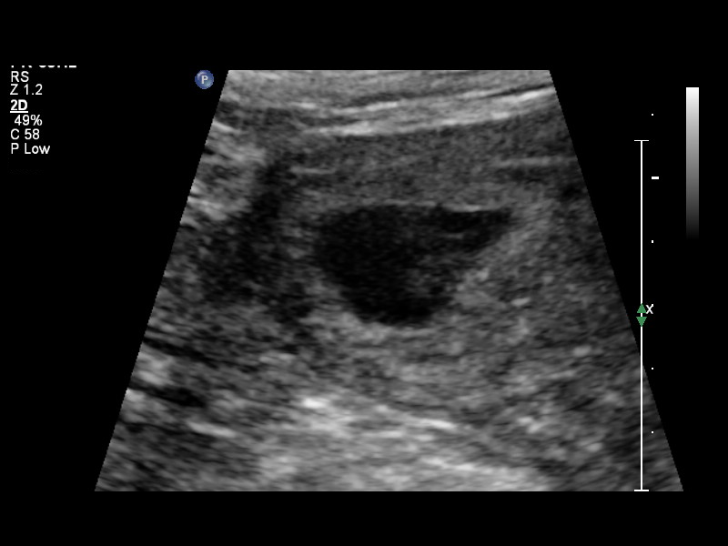
[im 10/23]
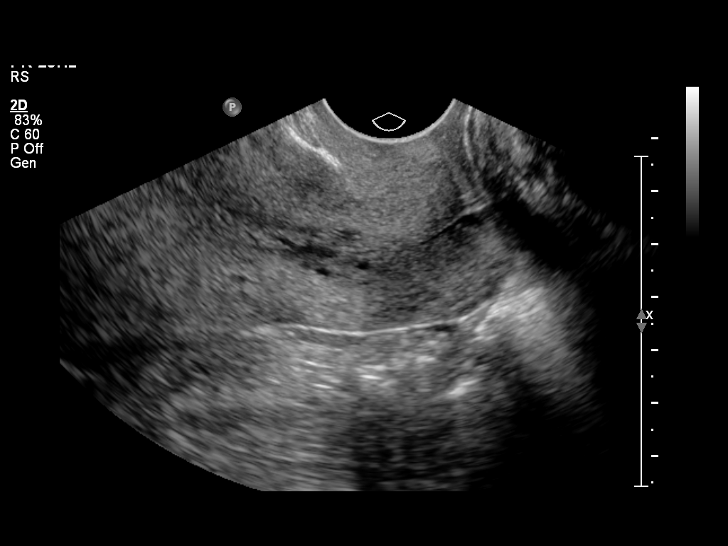
[im 11/23]
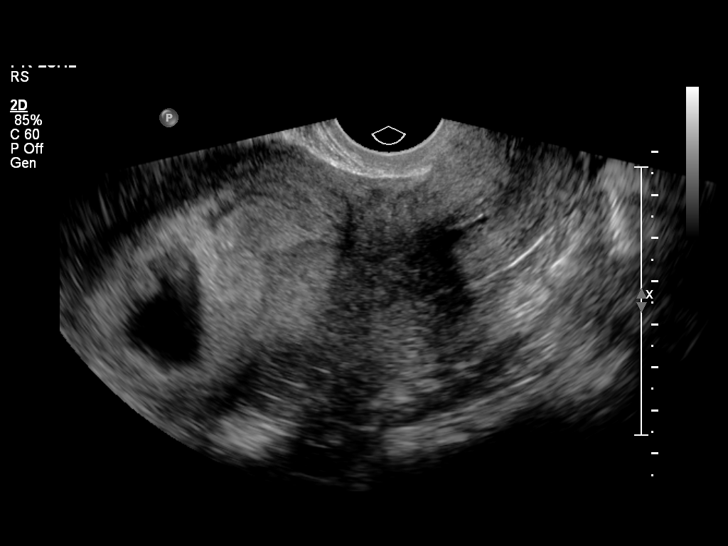
[im 13/23]
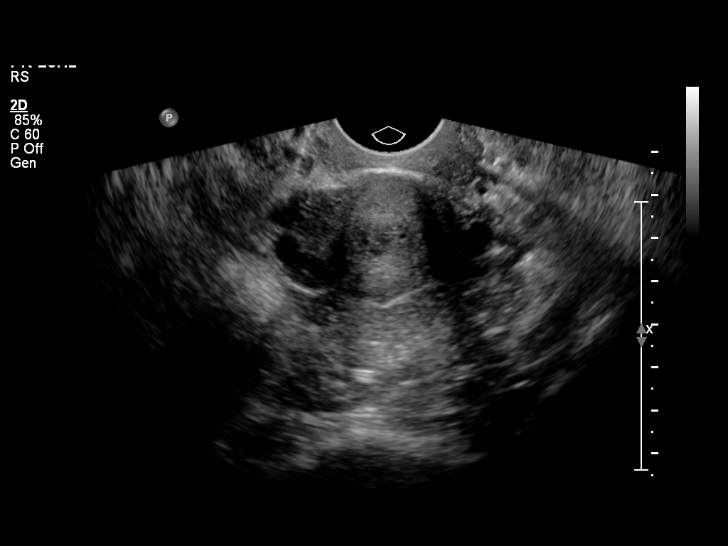
[im 14/23]
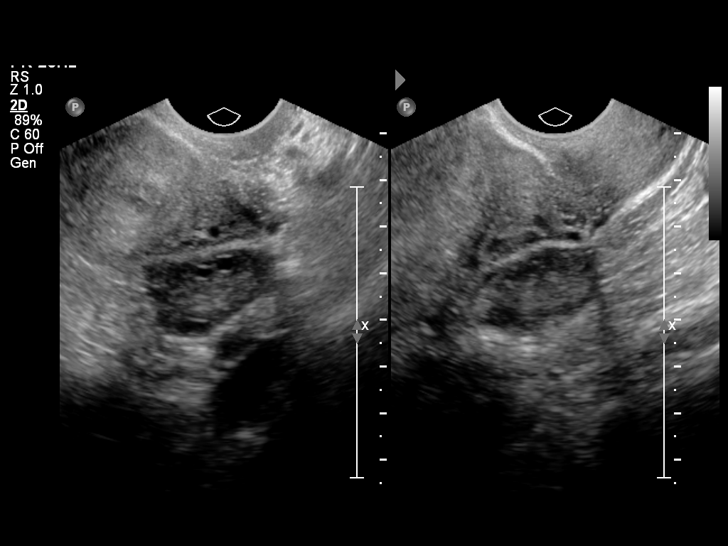
[im 16/23]
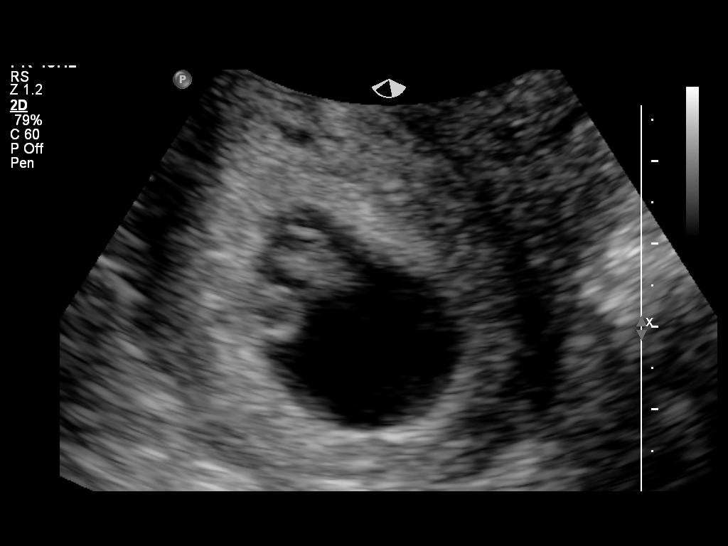
[im 18/23]
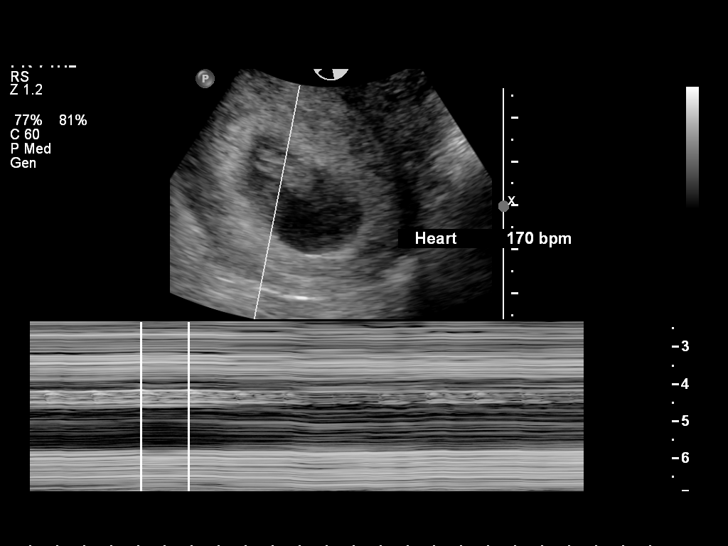
[im 19/23]
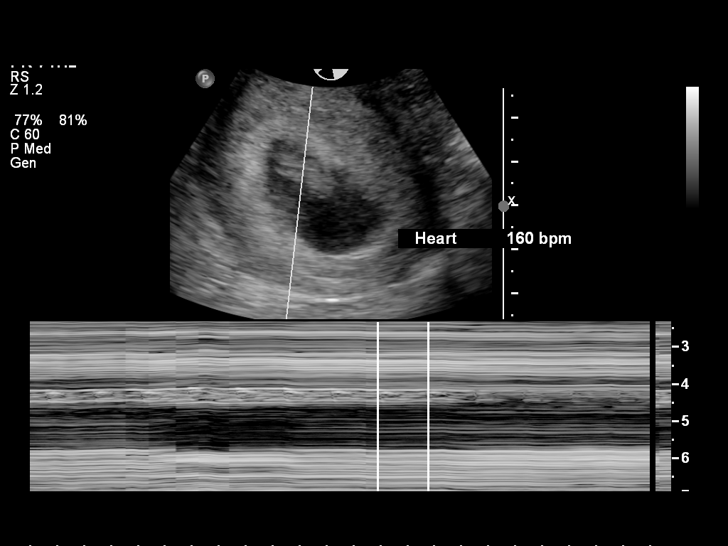
[im 21/23]
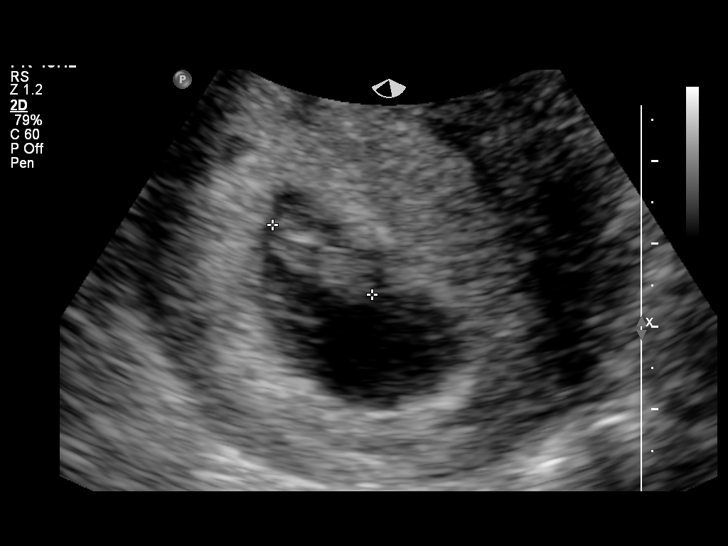
[im 23/23]
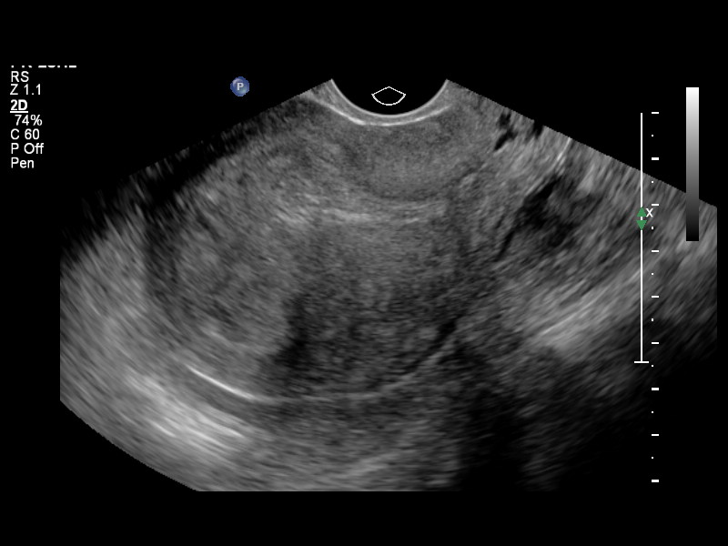

[14 of 23 positions shown; findings below may reference images not displayed]

FINDINGS: Intrauterine gestational sac: Single ovoid shaped gestational sac in
the fundal portion of the endometrial canal.

Yolk sac:  Present.

Embryo:  Present.

Cardiac Activity: Present.

Heart Rate: 160 bpm

CRL:   15  mm   7 w 6 d                  US EDC: 12/30/2013

Maternal uterus/adnexae: No evidence of subchorionic hemorrhage at
this time. Right ovary is poorly visualized, however, there appears
to be a dominant follicle or degenerating corpus luteum cyst in the
right ovary. Left ovary is unremarkable in appearance. No
significant free fluid in the cul-de-sac.
IMPRESSION: 1. Single viable IUP with estimated gestational age of 7 weeks and 6
days and normal fetal heart rate of 160 beats per min. No acute
findings.

## 2016-02-01 IMAGING — US US OB COMP +14 WK
1 series · 12 of 28 positions shown · non-contrast
Comparison: none

[Series 1: us ob comp +14 wk · 12 of 77 slices shown]
[im 3/77]
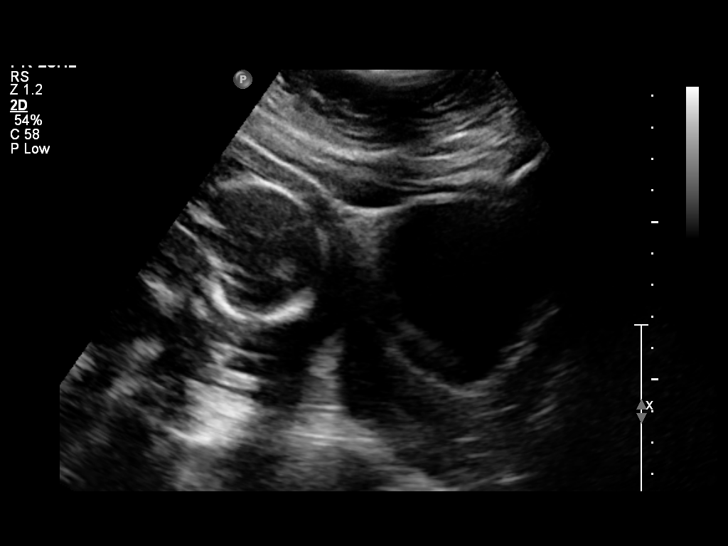
[im 9/77]
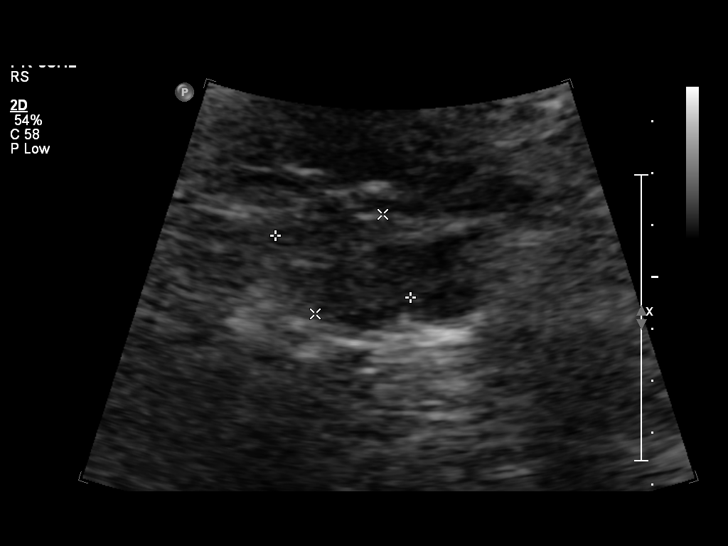
[im 15/77]
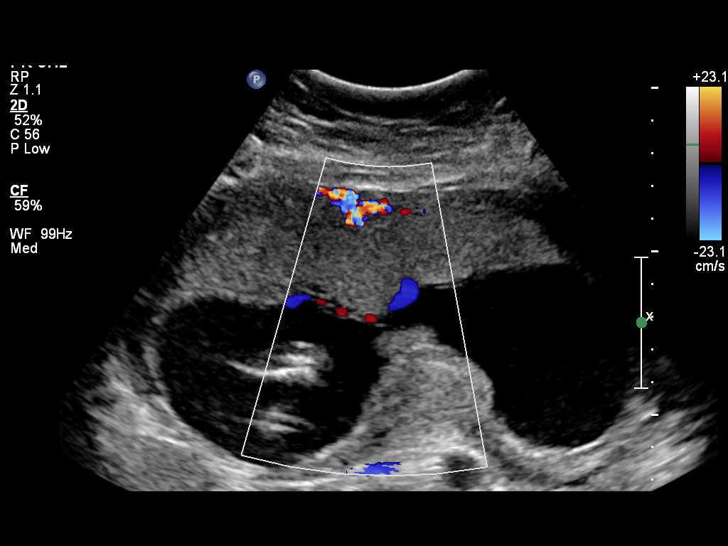
[im 23/77]
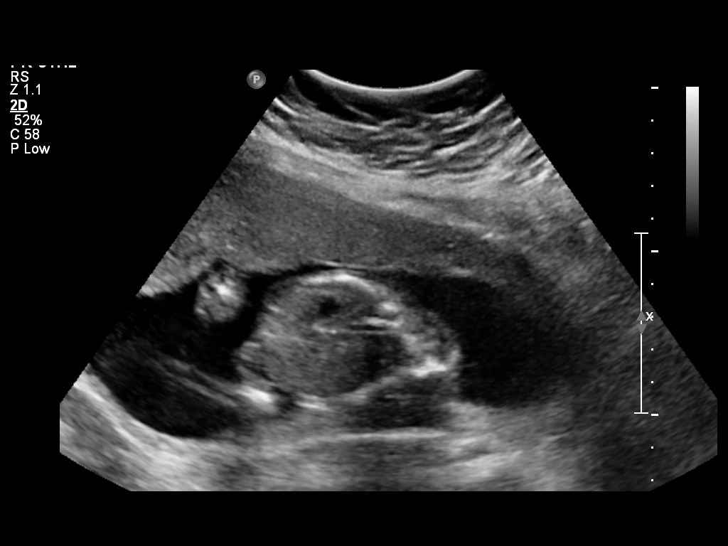
[im 29/77]
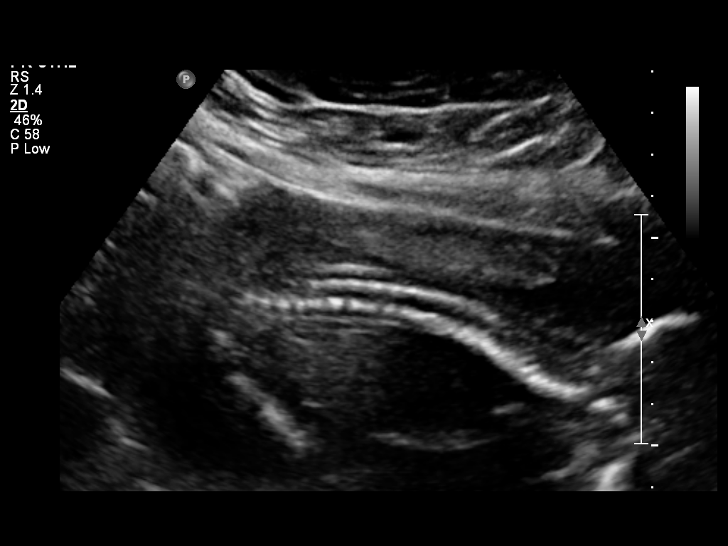
[im 34/77]
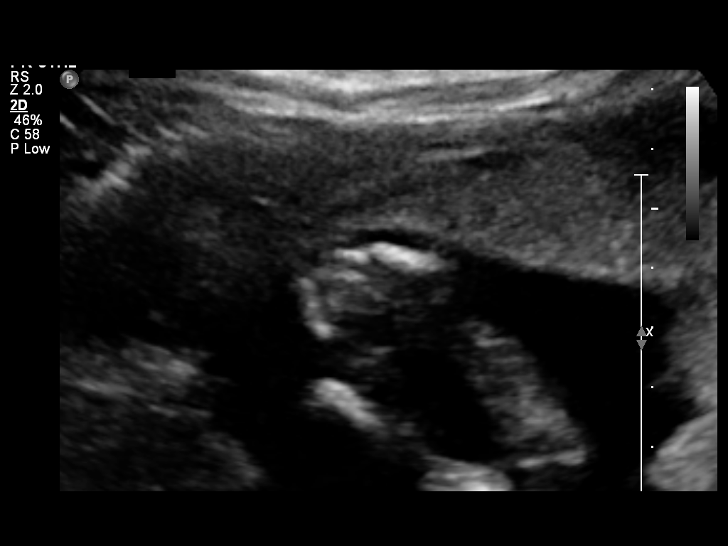
[im 43/77]
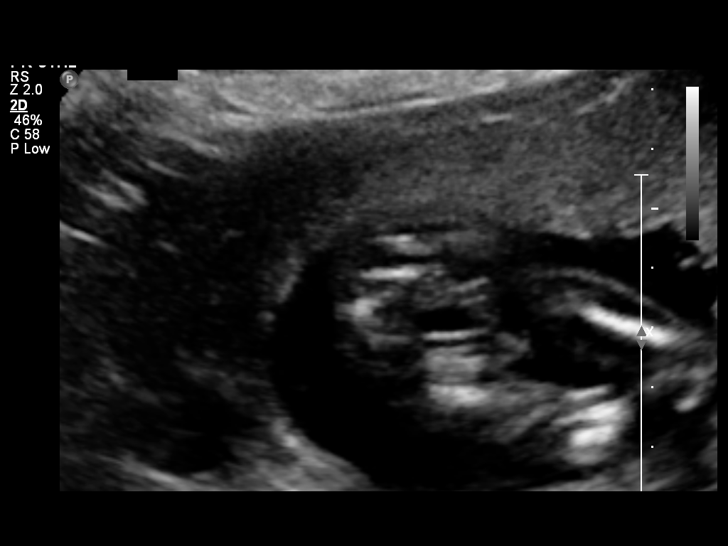
[im 48/77]
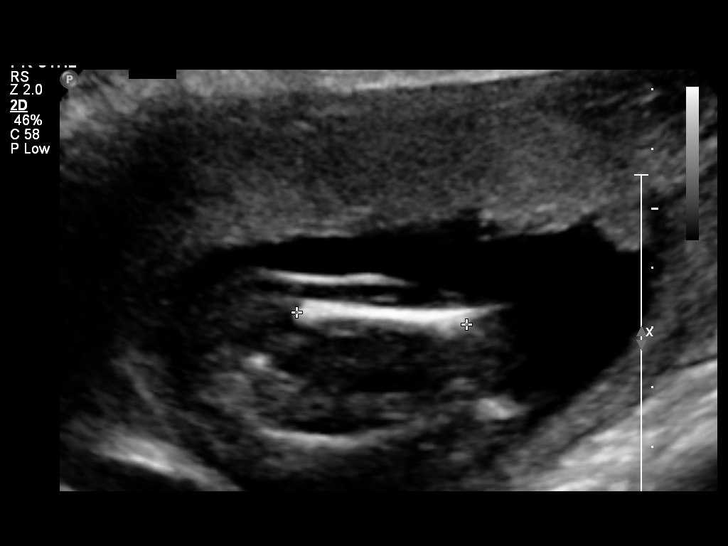
[im 54/77]
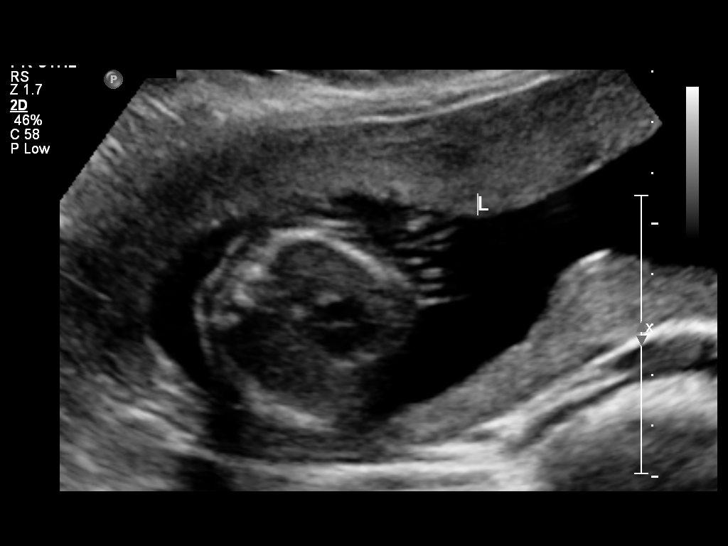
[im 62/77]
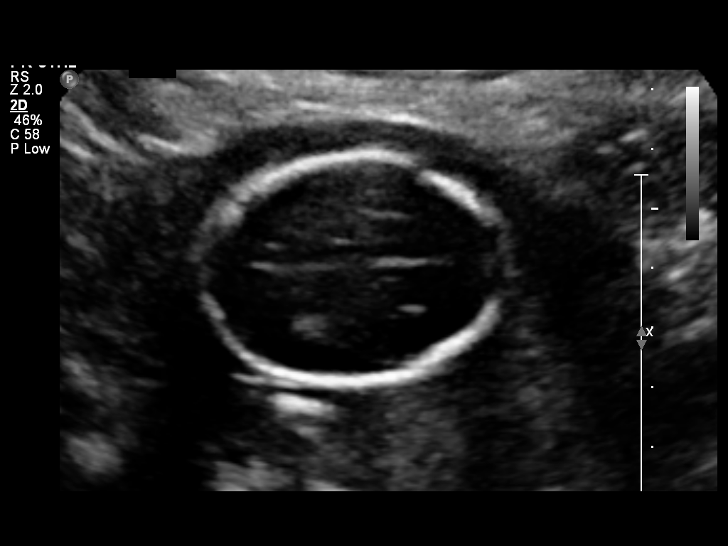
[im 68/77]
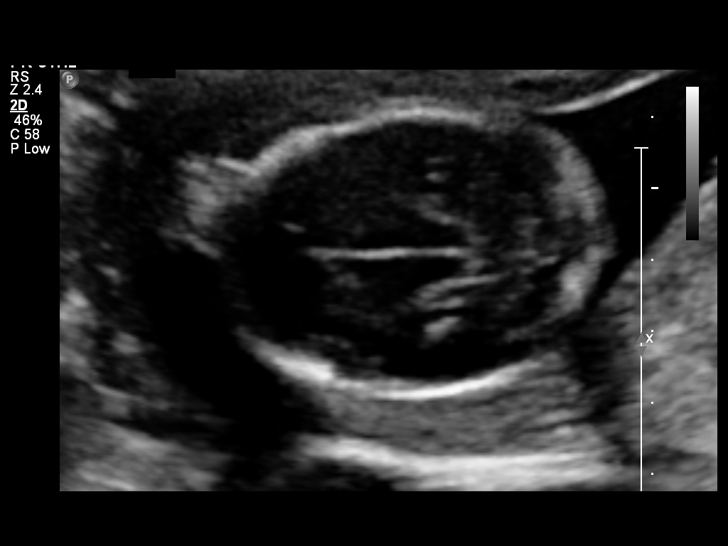
[im 74/77]
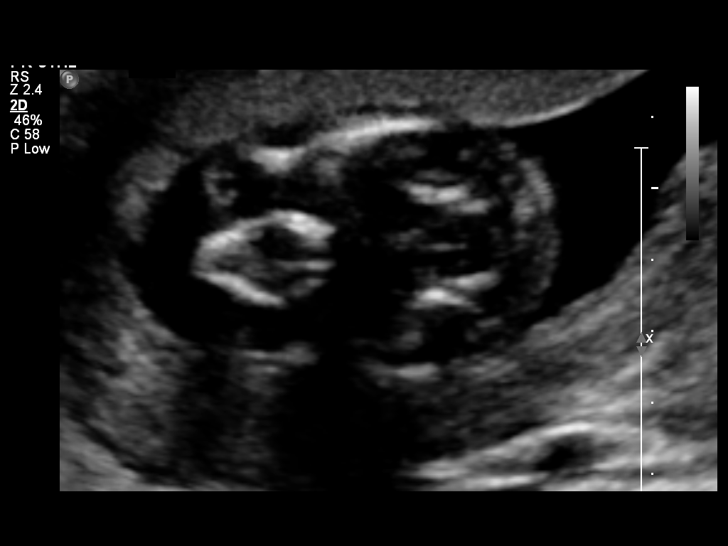

[12 of 28 positions shown; findings below may reference images not displayed]

OBSTETRICS REPORT
                      (Signed Final 08/03/2013 [DATE])

Service(s) Provided

 US OB COMP + 14 WK                                    76805.1
Indications

 Basic anatomic survey
Fetal Evaluation

 Num Of Fetuses:    1
 Fetal Heart Rate:  150                          bpm
 Cardiac Activity:  Observed
 Presentation:      Cephalic
 Placenta:          Anterior, above cervical os
 P. Cord            Visualized, central
 Insertion:

 Amniotic Fluid
 AFI FV:      Subjectively within normal limits
                                             Larg Pckt:    7.09  cm
Biometry

 BPD:     39.4  mm     G. Age:  18w 0d                CI:        68.93   70 - 86
                                                      FL/HC:      19.1   16.1 -

 HC:     151.6  mm     G. Age:  18w 1d       20  %    HC/AC:      1.13   1.09 -

 AC:     134.2  mm     G. Age:  18w 6d       52  %    FL/BPD:
 FL:      28.9  mm     G. Age:  18w 6d       50  %    FL/AC:      21.5   20 - 24

 Est. FW:     258  gm      0 lb 9 oz     47  %
Gestational Age

 U/S Today:     18w 3d                                        EDD:   01/01/14
 Best:          18w 5d     Det. By:  Early Ultrasound         EDD:   12/30/13
                                     (05/19/13)
Anatomy

 Cranium:          Appears normal         Aortic Arch:      Not well visualized
 Fetal Cavum:      Appears normal         Ductal Arch:      Not well visualized
 Ventricles:       Appears normal         Diaphragm:        Not well visualized
 Choroid Plexus:   Appears normal         Stomach:          Appears normal, left
                                                            sided
 Cerebellum:       Appears normal         Abdomen:          Not well visualized
 Posterior Fossa:  Appears normal         Abdominal Wall:   Not well visualized
 Nuchal Fold:      Not well visualized    Cord Vessels:     Appears normal (3
                                                            vessel cord)
 Face:             Not well visualized    Kidneys:          Appear normal
 Lips:             Not well visualized    Bladder:          Appears normal
 Heart:            Not well visualized    Spine:            Appears normal
 RVOT:             Not well visualized    Lower             Visualized
                                          Extremities:
 LVOT:             Not well visualized    Upper             Visualized
                                          Extremities:

 Other:  Fetus appears to be a male. Technically difficult due to early GA.
         Technically difficult due to fetal position. Technically difficult due to
         maternal habitus.
Cervix Uterus Adnexa

 Cervical Length:    5.17     cm

 Cervix:       Normal appearance by transabdominal scan.
 Uterus:       No abnormality visualized.
 Cul De Sac:   No free fluid seen.
 Left Ovary:    Not visualized.
 Right Ovary:   Within normal limits.

 Adnexa:     No abnormality visualized.
Impression

 Active SIUP at 45w7d
 EFW 47%
 No dysmorphic features with limitations as documented above
 No previa
Recommendations

 Recommend follow up ultrasound in about 8 weeks to
 complete anatomy.

## 2016-03-31 IMAGING — US US OB FOLLOW-UP
1 series · 13 of 28 positions shown · non-contrast
Comparison: none

[Series 1: us ob follow up · 13 of 40 slices shown]
[im 2/40]
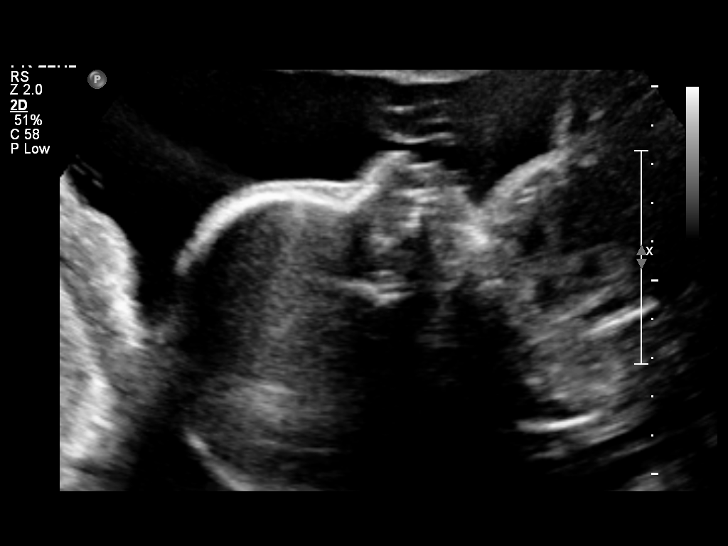
[im 5/40]
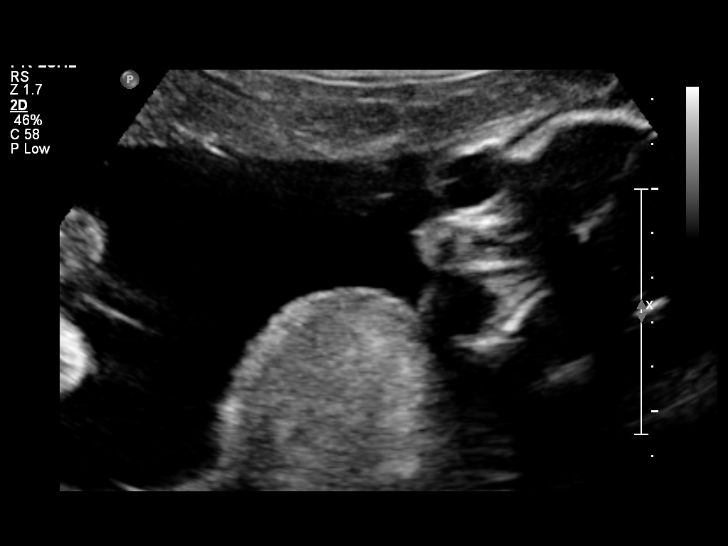
[im 8/40]
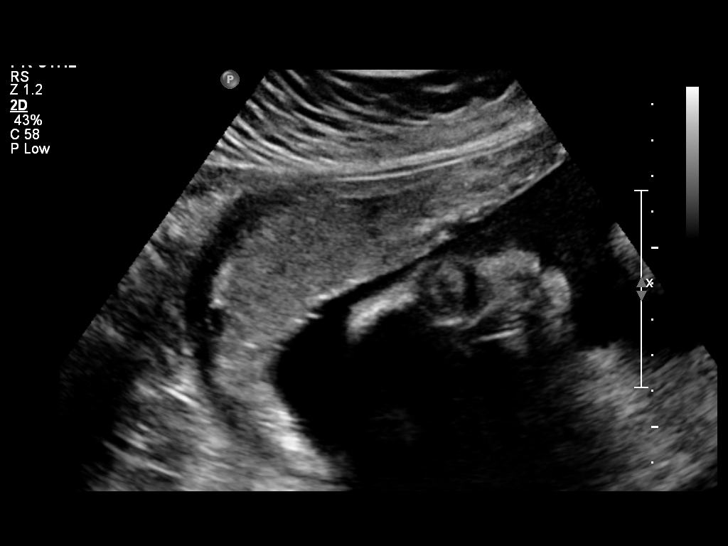
[im 11/40]
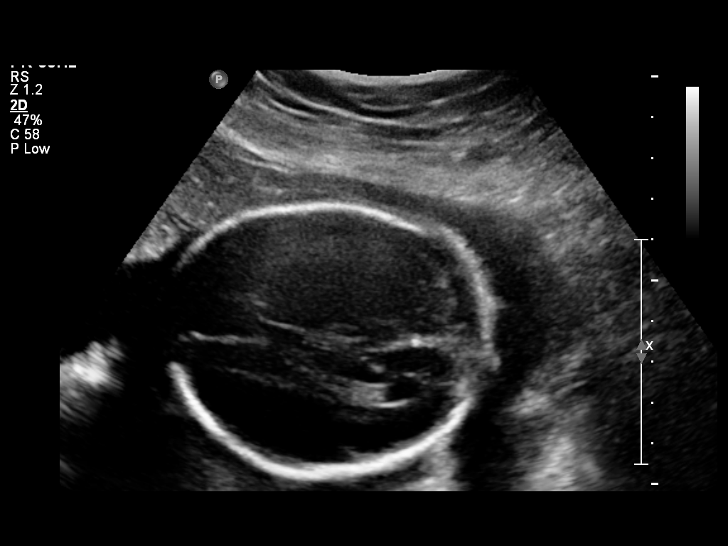
[im 14/40]
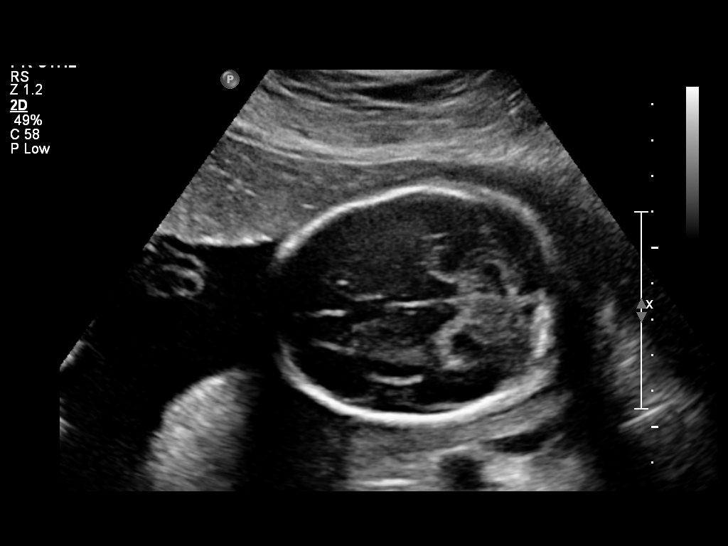
[im 16/40]
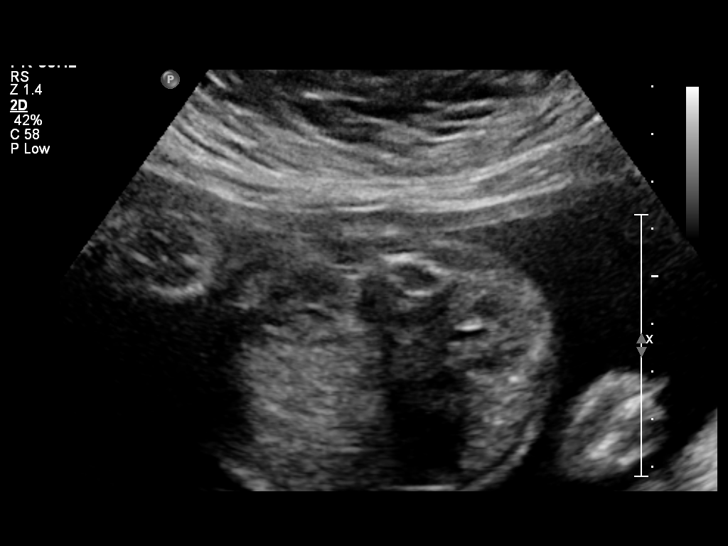
[im 21/40]
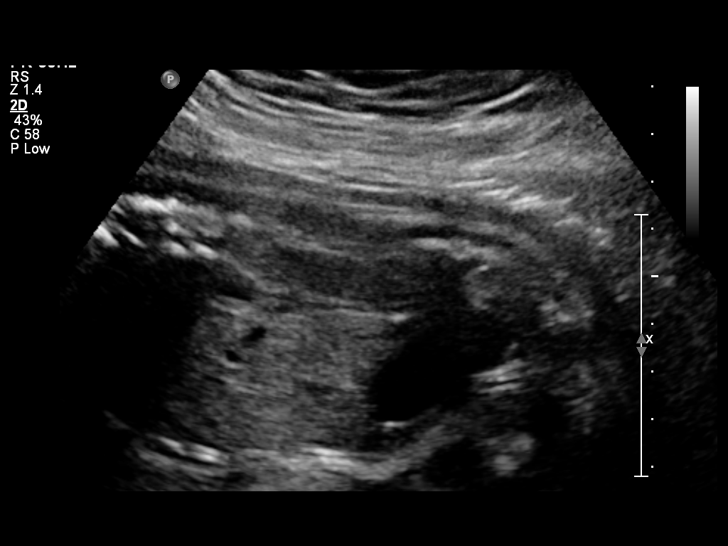
[im 24/40]
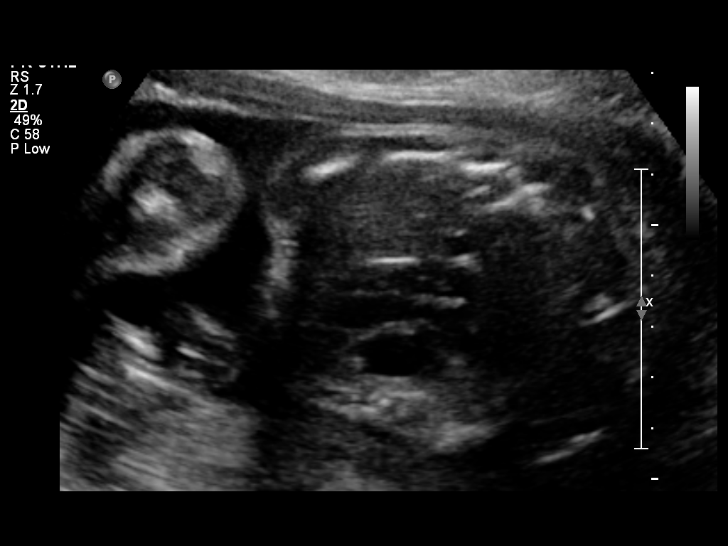
[im 27/40]
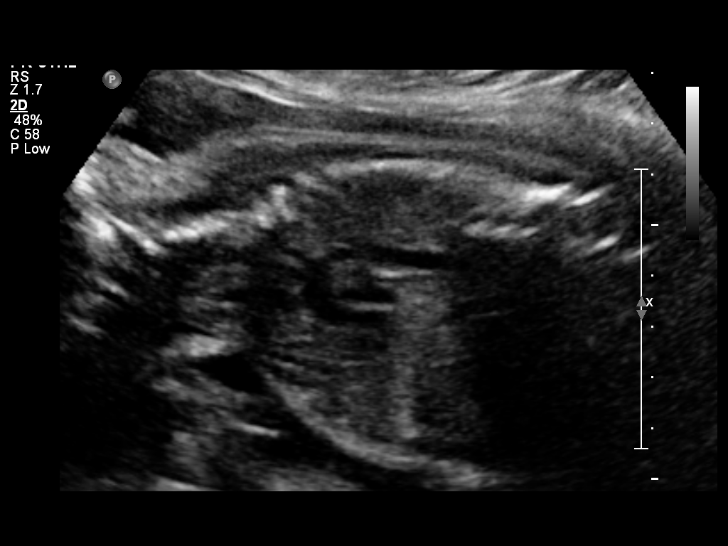
[im 29/40]
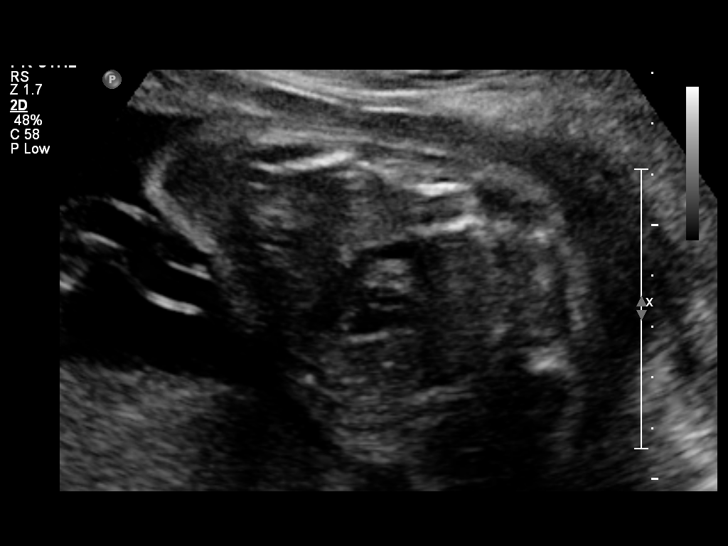
[im 32/40]
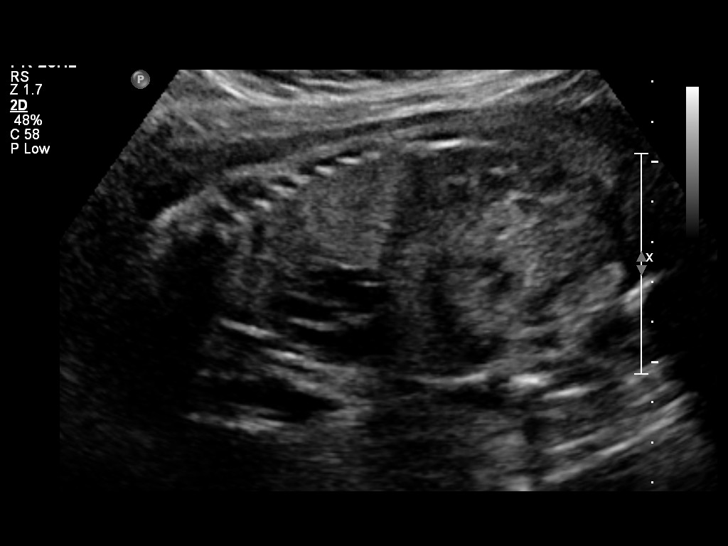
[im 35/40]
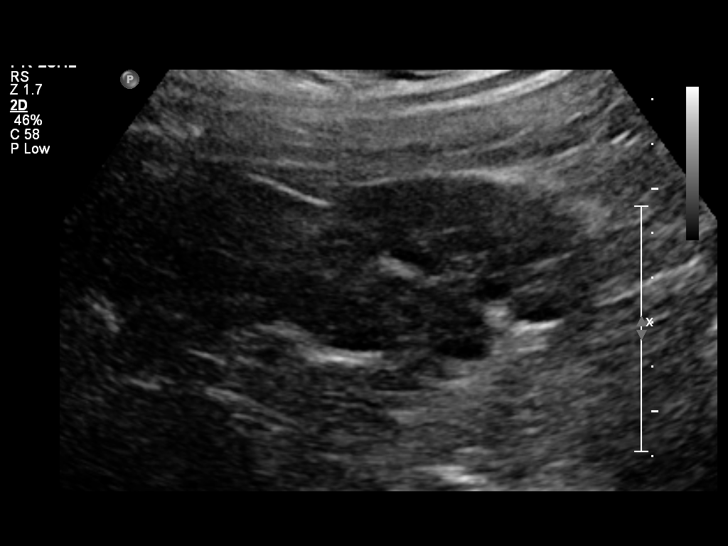
[im 38/40]
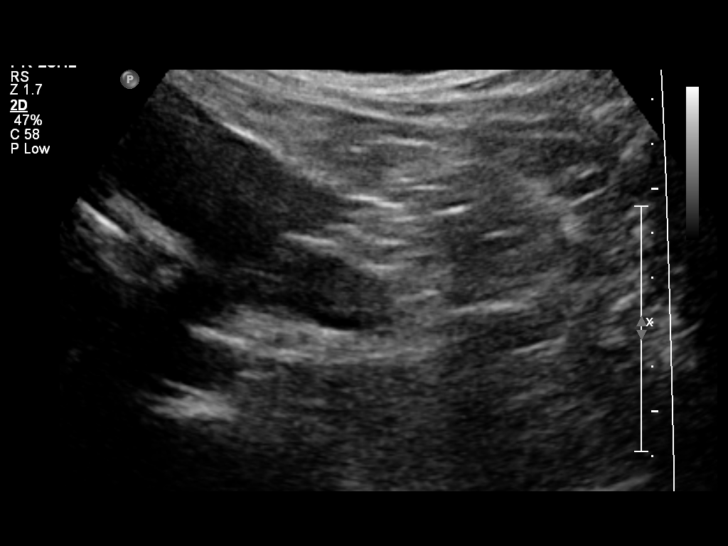

[13 of 28 positions shown; findings below may reference images not displayed]

OBSTETRICS REPORT
                      (Signed Final 10/01/2013 [DATE])

Service(s) Provided

 US OB FOLLOW UP                                       76816.1
Indications

 Follow-up incomplete fetal anatomic evaluation
Fetal Evaluation

 Num Of Fetuses:    1
 Fetal Heart Rate:  128                          bpm
 Cardiac Activity:  Observed
 Presentation:      Breech
 Placenta:          Anterior, above cervical os

 Amniotic Fluid
 AFI FV:      Subjectively within normal limits
                                             Larg Pckt:     5.3  cm
Biometry

 BPD:     64.5  mm     G. Age:  26w 1d                CI:         75.7   70 - 86
 OFD:     85.2  mm                                    FL/HC:      20.6   18.6 -

 HC:     243.4  mm     G. Age:  26w 3d        8  %    HC/AC:      1.08   1.05 -

 AC:       226  mm     G. Age:  27w 0d       38  %    FL/BPD:     77.8   71 - 87
 FL:      50.2  mm     G. Age:  27w 0d       31  %    FL/AC:      22.2   20 - 24

 Est. FW:     996  gm      2 lb 3 oz     47  %
Gestational Age

 U/S Today:     26w 4d                                        EDD:   01/03/14
 Best:          27w 1d     Det. By:  Early Ultrasound         EDD:   12/30/13
                                     (05/19/13)
Anatomy

 Cranium:          Appears normal         Aortic Arch:      Appears normal
 Fetal Cavum:      Appears normal         Ductal Arch:      Not well visualized
 Ventricles:       Appears normal         Diaphragm:        Appears normal
 Choroid Plexus:   Previously seen        Stomach:          Appears normal
 Cerebellum:       Appears normal         Abdomen:          Appears normal
 Posterior Fossa:  Previously seen        Abdominal Wall:   Previously seen
 Nuchal Fold:      Not applicable (>20    Cord Vessels:     Previously seen
                   wks GA)
 Face:             Appears normal         Kidneys:          Appear normal
                   (orbits and profile)
 Lips:             Appears normal         Bladder:          Appears normal
 Heart:            Appears normal         Spine:            Previously seen
                   (4CH, axis, and
                   situs)
 RVOT:             Appears normal         Lower             Previously seen
                                          Extremities:
 LVOT:             Appears normal         Upper             Previously seen
                                          Extremities:

 Other:  Fetus appears to be a male. Technically difficult due to fetal position.
Cervix Uterus Adnexa

 Cervical Length:    5.12     cm

 Cervix:       Normal appearance by transabdominal scan.

 Adnexa:     No abnormality visualized.
Impression

 SIUP at 27+1 weeks
 Normal interval anatomy; anatomic survey complete except
 for DA
 Normal amniotic fluid volume
 Appropriate interval growth with EFW at the 47th %tile
Recommendations

 Follow-up as clinically indicated
